# Patient Record
Sex: Male | Born: 1970 | Race: White | Hispanic: No | Marital: Married | Smoking: Former smoker
Health system: Southern US, Community
[De-identification: ages and names within clinical notes are randomized; demographics above are authoritative.]

## PROBLEM LIST (undated history)

## (undated) DIAGNOSIS — M1990 Unspecified osteoarthritis, unspecified site: Secondary | ICD-10-CM

## (undated) DIAGNOSIS — F32A Depression, unspecified: Secondary | ICD-10-CM

## (undated) DIAGNOSIS — R569 Unspecified convulsions: Secondary | ICD-10-CM

## (undated) DIAGNOSIS — E785 Hyperlipidemia, unspecified: Secondary | ICD-10-CM

## (undated) DIAGNOSIS — F419 Anxiety disorder, unspecified: Secondary | ICD-10-CM

## (undated) DIAGNOSIS — F319 Bipolar disorder, unspecified: Secondary | ICD-10-CM

## (undated) HISTORY — DX: Depression, unspecified: F32.A

## (undated) HISTORY — DX: Bipolar disorder, unspecified: F31.9

## (undated) HISTORY — PX: SHOULDER SURGERY: SHX246

## (undated) HISTORY — DX: Unspecified osteoarthritis, unspecified site: M19.90

## (undated) HISTORY — PX: KNEE ARTHROSCOPY: SHX127

## (undated) HISTORY — DX: Anxiety disorder, unspecified: F41.9

## (undated) HISTORY — PX: HAND SURGERY: SHX662

## (undated) HISTORY — PX: HERNIA REPAIR: SHX51

## (undated) HISTORY — DX: Unspecified convulsions: R56.9

## (undated) HISTORY — DX: Hyperlipidemia, unspecified: E78.5

---

## 2016-12-09 ENCOUNTER — Encounter: Payer: Self-pay | Admitting: Nurse Practitioner

## 2016-12-09 ENCOUNTER — Ambulatory Visit (INDEPENDENT_AMBULATORY_CARE_PROVIDER_SITE_OTHER): Payer: 59 | Admitting: Nurse Practitioner

## 2016-12-09 ENCOUNTER — Other Ambulatory Visit: Payer: Self-pay

## 2016-12-09 VITALS — BP 123/71 | Temp 98.3°F | Ht 73.0 in | Wt 191.0 lb

## 2016-12-09 DIAGNOSIS — F411 Generalized anxiety disorder: Secondary | ICD-10-CM

## 2016-12-09 DIAGNOSIS — Z7689 Persons encountering health services in other specified circumstances: Secondary | ICD-10-CM

## 2016-12-09 MED ORDER — BUPROPION HCL ER (XL) 150 MG PO TB24
150.0000 mg | ORAL_TABLET | Freq: Every day | ORAL | 1 refills | Status: DC
Start: 2016-12-09 — End: 2017-02-02

## 2016-12-09 MED ORDER — BUSPIRONE HCL 7.5 MG PO TABS: 7.5000 mg | ORAL_TABLET | Freq: Two times a day (BID) | ORAL | 1 refills | Status: DC

## 2016-12-09 NOTE — Progress Notes (Signed)
Subjective:    Patient ID: Troy Dunn, male    DOB: [birth year], 46 y.o.   MRN: [id number]  Troy Dunn is a 46 y.o. male presenting on 12/09/2016 for Establish Care (anxiety medication x 3972yrs. Pt was diagnose w/ bipolar.)   HPI Establish Care New Provider Obtain records from [facility] [place] [place] - No PCP in recent past.  Anxiety Was on anxiety medications 10 years ago.  Past medications, keppra, zyprexa, zoloft, paxil, seroquel (made him feel "like a zombie"). SSRI have always caused decreased libido and he states he does not want to try these again.  Was diagnosed w/ bipolar disorder by [facility] in [place], but has never really felt manic or depressed.  - He reports episodes of sudden onset rapid heart racing, feeling of needing to be "mobile/up walking around" during quiet activities like watching TV even when he does not have additional stressors.  Patient desires to resume treatment for his anxiety and bipolar disorder as his current response to stress is not manageable.  He also wants to avoid self-medication because he is also self medicating w/ marijuana 1-2 times per week.  Patient states it helps him sleep and does not sleep very well without it.  He has gone more than 48 hours without sleep in the last 2 weeks. 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  He is able to maintain work without behavior or attendance problems. - Now going through divorce she has moved from [place] to [place] to take care of his father w/ prostate CA and early onset dementia.  - 4 children are in HS, college, early careers.   Past Medical History:  Diagnosis Date  . Arthritis    R knee > L knee  . Bipolar disorder (HCC)   . Seizures (HCC)    Childhood, w/o seizures after age 89   Past Surgical History:  Procedure Laterality Date  . HAND SURGERY Left   . KNEE ARTHROSCOPY Bilateral   . SHOULDER SURGERY     Social  History   Socioeconomic History  . Marital status: Legally Separated    Spouse name: Not on file  . Number of children: 4  . Years of education: Not on file  . Highest education level: Some college, no degree  Social Needs  . Financial resource strain: Not on file  . Food insecurity - worry: Not on file  . Food insecurity - inability: Not on file  . Transportation needs - medical: Not on file  . Transportation needs - non-medical: Not on file  Occupational History  . Not on file  Tobacco Use  . Smoking status: Former Smoker    Packs/day: 1.50    Years: 10.00    Pack years: 15.00    Last attempt to quit: 12/09/2000    Years since quitting: 16.0  . Smokeless tobacco: Never Used  Substance and Sexual Activity  . Alcohol use: Yes  . Drug use: Yes    Frequency: 2.0 times per week    Types: Marijuana  . Sexual activity: Yes  Other Topics Concern  . Not on file  Social History Narrative  . Not on file   Family History  Problem Relation Age of Onset  . Heart disease Mother   . Heart disease Father   . Dementia Father   . Heart disease Maternal Grandmother   . Heart disease Maternal Grandfather   . Heart disease Paternal Grandmother   .  Healthy Daughter   . Healthy Son   . Healthy Daughter   . Healthy Son   . Epilepsy Sister   . Epilepsy Paternal Uncle    No current outpatient medications on file prior to visit.   No current facility-administered medications on file prior to visit.     Review of Systems  Constitutional: Negative.   HENT: Negative.   Eyes: Negative.   Respiratory: Negative.   Cardiovascular: Negative.   Gastrointestinal: Negative.   Endocrine: Negative.   Genitourinary: Negative.   Musculoskeletal: Negative.   Skin: Negative.   Allergic/Immunologic: Negative.   Neurological: Negative.   Hematological: Negative.   Psychiatric/Behavioral: Positive for agitation and sleep disturbance. Negative for behavioral problems, confusion, decreased  concentration, dysphoric mood, hallucinations, self-injury and suicidal ideas. The patient is nervous/anxious and is hyperactive.    Per HPI unless specifically indicated above     Objective:    BP 123/71 (BP Location: Right Arm, Patient Position: Sitting, Cuff Size: Normal)   Temp 98.3 F (36.8 C) (Oral)   Ht 6\' 1"  (1.854 m)   Wt 191 lb (86.6 kg)   BMI 25.20 kg/m   Wt Readings from Last 3 Encounters:  12/09/16 191 lb (86.6 kg)    Physical Exam  General - healthy, well-appearing, NAD HEENT - Normocephalic, atraumatic Heart - RRR, no murmurs heard Lungs - Clear throughout all lobes, no wheezing, crackles, or rhonchi. Normal work of breathing. Extremeties - non-tender, no edema, cap refill < 2 seconds, peripheral pulses intact +2 bilaterally Skin - warm, dry Neuro - awake, alert, oriented x3, normal gait Psych - anxious mood and affect, normal behavior.  Participates in conversation appropriately.     Assessment & Plan:   Problem List Items Addressed This Visit    None    Visit Diagnoses    Generalized anxiety disorder    -  Primary   Relevant Medications   busPIRone (BUSPAR) 7.5 MG tablet   buPROPion (WELLBUTRIN XL) 150 MG 24 hr tablet      Meds ordered this encounter  Medications  . busPIRone (BUSPAR) 7.5 MG tablet    Sig: Take 1 tablet (7.5 mg total) 2 (two) times daily by mouth.    Dispense:  60 tablet    Refill:  1    Order Specific Question:   Supervising Provider    Answer:   Smitty CordsKARAMALEGOS, ALEXANDER J [[id number]]  . buPROPion (WELLBUTRIN XL) 150 MG 24 hr tablet    Sig: Take 1 tablet (150 mg total) daily by mouth.    Dispense:  30 tablet    Refill:  1    Order Specific Question:   Supervising Provider    Answer:   Smitty CordsKARAMALEGOS, ALEXANDER J [[id number]]      Follow up plan: Return in about 4 weeks (around 01/06/2017) for anxiety.  Wilhelmina McardleLauren Izel Eisenhardt, DNP, AGPCNP-BC Adult Gerontology Primary Care Nurse Practitioner [facility] 12/19/2016, 4:50 PM

## 2016-12-09 NOTE — Patient Instructions (Addendum)
Tavyn, Thank you for coming in to clinic today.  1. For your anxiety: - START bupropion (Wellbutrin) XL 150 mg tablet.  Take one tablet by mouth once daily. - START buspirone (Buspar) 7.5 mg tablet.  Take one tablet twice daily.  Start with 1 tablet once daily for 7 days.  Then, increase to full dose.    2. For local resources for caregivers for managing dementia: [place]  Please schedule a follow-up appointment with Cassell Smiles, AGNP. Return in about 4 weeks (around 01/06/2017) for anxiety.  If you have any other questions or concerns, please feel free to call the clinic or send a message through [place]. You may also schedule an earlier appointment if necessary.  You will receive a survey after today's visit either digitally by e-mail or paper by [employer]. Your experiences and feedback matter to [place].  Please respond so we know how we are doing as we provide care for you.  Cassell Smiles, DNP, AGNP-BC Adult Gerontology Nurse Practitioner [facility], [facility]    Stress and Stress Management Stress is a normal reaction to life events. It is what you feel when life demands more than you are used to or more than you can handle. Some stress can be useful. For example, the stress reaction can help you catch the last bus of the day, study for a test, or meet a deadline at work. But stress that occurs too often or for too long can cause problems. It can affect your emotional health and interfere with relationships and normal daily activities. Too much stress can weaken your immune system and increase your risk for physical illness. If you already have a medical problem, stress can make it worse. What are the causes? All sorts of life events may cause stress. An event that causes stress for one person may not be stressful for another person. Major life events commonly cause stress. These may be positive or negative. Examples include losing your job, moving into a new  home, getting married, having a baby, or losing a loved one. Less obvious life events may also cause stress, especially if they occur day after day or in combination. Examples include working long hours, driving in traffic, caring for children, being in debt, or being in a difficult relationship. What are the signs or symptoms? Stress may cause emotional symptoms including, the following:  Anxiety. This is feeling worried, afraid, on edge, overwhelmed, or out of control.  Anger. This is feeling irritated or impatient.  Depression. This is feeling sad, down, helpless, or guilty.  Difficulty focusing, remembering, or making decisions.  Stress may cause physical symptoms, including the following:  Aches and pains. These may affect your head, neck, back, stomach, or other areas of your body.  Tight muscles or clenched jaw.  Low energy or trouble sleeping.  Stress may cause unhealthy behaviors, including the following:  Eating to feel better (overeating) or skipping meals.  Sleeping too little, too much, or both.  Working too much or putting off tasks (procrastination).  Smoking, drinking alcohol, or using drugs to feel better.  How is this diagnosed? Stress is diagnosed through an assessment by your health care provider. Your health care provider will ask questions about your symptoms and any stressful life events.Your health care provider will also ask about your medical history and may order blood tests or other tests. Certain medical conditions and medicine can cause physical symptoms similar to stress. Mental illness can cause emotional symptoms  and unhealthy behaviors similar to stress. Your health care provider may refer you to a mental health professional for further evaluation. How is this treated? Stress management is the recommended treatment for stress.The goals of stress management are reducing stressful life events and coping with stress in healthy ways. Techniques for  reducing stressful life events include the following:  Stress identification. Self-monitor for stress and identify what causes stress for you. These skills may help you to avoid some stressful events.  Time management. Set your priorities, keep a calendar of events, and learn to say "no." These tools can help you avoid making too many commitments.  Techniques for coping with stress include the following:  Rethinking the problem. Try to think realistically about stressful events rather than ignoring them or overreacting. Try to find the positives in a stressful situation rather than focusing on the negatives.  Exercise. Physical exercise can release both physical and emotional tension. The key is to find a form of exercise you enjoy and do it regularly.  Relaxation techniques. These relax the body and mind. Examples include yoga, meditation, tai chi, biofeedback, deep breathing, progressive muscle relaxation, listening to music, being out in nature, journaling, and other hobbies. Again, the key is to find one or more that you enjoy and can do regularly.  Healthy lifestyle. Eat a balanced diet, get plenty of sleep, and do not smoke. Avoid using alcohol or drugs to relax.  Strong support network. Spend time with family, friends, or other people you enjoy being around.Express your feelings and talk things over with someone you trust.  Counseling or talktherapy with a mental health professional may be helpful if you are having difficulty managing stress on your own. Medicine is typically not recommended for the treatment of stress.Talk to your health care provider if you think you need medicine for symptoms of stress. Follow these instructions at home:  Keep all follow-up visits as directed by your health care provider.  Take all medicines as directed by your health care provider. Contact a health care provider if:  Your symptoms get worse or you start having new symptoms.  You feel  overwhelmed by your problems and can no longer manage them on your own. Get help right away if:  You feel like hurting yourself or someone else. This information is not intended to replace advice given to you by your health care provider. Make sure you discuss any questions you have with your health care provider. Document Released: 07/02/2000 Document Revised: 06/14/2015 Document Reviewed: 08/31/2012 Elsevier Interactive Patient Education  2017 [employer].

## 2016-12-19 ENCOUNTER — Encounter: Payer: Self-pay | Admitting: Nurse Practitioner

## 2016-12-19 DIAGNOSIS — F411 Generalized anxiety disorder: Secondary | ICD-10-CM | POA: Insufficient documentation

## 2016-12-19 NOTE — Assessment & Plan Note (Signed)
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  Patient has also previously been diagnosed with bipolar disorder but has not exhibited signs of mania or severe depression.  Bipolar disorder diagnosis may not be appropriate.  Plan:  1.START bupropion (Wellbutrin) XL 150 mg tablet.  Take one tablet by mouth once daily. - START buspirone (Buspar) 7.5 mg tablet.  Take one tablet twice daily.  Start with 1 tablet once daily for 7 days.  Then, increase to full dose. 2.  Encouraged stress management strategies for self-care. 3.  Recommended patient use area resources for assistance with caregiving responsibilities.  Provided references for [place] [facility] for dementia resources. 4.  Return in 4 weeks for follow-up.

## 2016-12-23 ENCOUNTER — Ambulatory Visit (INDEPENDENT_AMBULATORY_CARE_PROVIDER_SITE_OTHER): Payer: Self-pay | Admitting: Nurse Practitioner

## 2016-12-23 ENCOUNTER — Encounter: Payer: Self-pay | Admitting: Nurse Practitioner

## 2016-12-23 ENCOUNTER — Other Ambulatory Visit: Payer: Self-pay

## 2016-12-23 VITALS — BP 131/71 | HR 57 | Temp 97.9°F | Ht 73.0 in | Wt 191.6 lb

## 2016-12-23 DIAGNOSIS — R197 Diarrhea, unspecified: Secondary | ICD-10-CM

## 2016-12-23 DIAGNOSIS — T732XXA Exhaustion due to exposure, initial encounter: Secondary | ICD-10-CM

## 2016-12-23 DIAGNOSIS — R110 Nausea: Secondary | ICD-10-CM

## 2016-12-23 NOTE — Patient Instructions (Addendum)
Troy Dunn, Thank you for coming in to clinic today.  1. You likely have a gastrointestinal (GI) viral illness that will run its course over 24-48 hours.   - Drink plenty of fluids including water and consider adding 1-2 gatorade/powerade drinks while you have vomiting or diarrhea. - Wash your hands frequently with soap and water.  Please schedule a follow-up appointment with Troy Dunn, AGNP. Return 1-2 days if symptoms worsen or fail to improve.  If you have any other questions or concerns, please feel free to call the clinic or send a message through MyChart. You may also schedule an earlier appointment if necessary.  You will receive a survey after today's visit either digitally by e-mail or paper by [employer] mail. Your experiences and feedback matter to us.  Please respond so we know how we are doing as we provide care for you.   Troy McardleLauren Shiya Fogelman, DNP, AGNP-BC Adult Gerontology Nurse Practitioner [facility], [facility]   Viral Gastroenteritis, Adult Viral gastroenteritis is also known as the stomach flu. This condition is caused by certain germs (viruses). These germs can be passed from person to person very easily (are very contagious). This condition can cause sudden watery poop (diarrhea), fever, and throwing up (vomiting). Having watery poop and throwing up can make you feel weak and cause you to get dehydrated. Dehydration can make you tired and thirsty, make you have a dry mouth, and make it so you pee (urinate) less often. Older adults and people with other diseases or a weak defense system (immune system) are at higher risk for dehydration. It is important to replace the fluids that you lose from having watery poop and throwing up. Follow these instructions at home: Follow instructions from your doctor about how to care for yourself at home. Eating and drinking  Follow these instructions as told by your doctor:  Take an oral rehydration solution (ORS). This is a drink  that is sold at pharmacies and stores.  Drink clear fluids in small amounts as you are able, such as: ? Water. ? Ice chips. ? Diluted fruit juice. ? Low-calorie sports drinks.  Eat bland, easy-to-digest foods in small amounts as you are able, such as: ? Bananas. ? Applesauce. ? Rice. ? Low-fat (lean) meats. ? Toast. ? Crackers.  Avoid fluids that have a lot of sugar or caffeine in them.  Avoid alcohol.  Avoid spicy or fatty foods.  General instructions  Drink enough fluid to keep your pee (urine) clear or pale yellow.  Wash your hands often. If you cannot use soap and water, use hand sanitizer.  Make sure that all people in your home wash their hands well and often.  Rest at home while you get better.  Take over-the-counter and prescription medicines only as told by your doctor.  Watch your condition for any changes.  Take a warm bath to help with any burning or pain from having watery poop.  Keep all follow-up visits as told by your doctor. This is important. Contact a doctor if:  You cannot keep fluids down.  Your symptoms get worse.  You have new symptoms.  You feel light-headed or dizzy.  You have muscle cramps. Get help right away if:  You have chest pain.  You feel very weak or you pass out (faint).  You see blood in your throw-up.  Your throw-up looks like coffee grounds.  You have bloody or black poop (stools) or poop that look like tar.  You have a very  bad headache, a stiff neck, or both.  You have a rash.  You have very bad pain, cramping, or bloating in your belly (abdomen).  You have trouble breathing.  You are breathing very quickly.  Your heart is beating very quickly.  Your skin feels cold and clammy.  You feel confused.  You have pain when you pee.  You have signs of dehydration, such as: ? Dark pee, hardly any pee, or no pee. ? Cracked lips. ? Dry mouth. ? Sunken eyes. ? Sleepiness. ? Weakness. This information  is not intended to replace advice given to you by your health care provider. Make sure you discuss any questions you have with your health care provider. Document Released: 06/25/2007 Document Revised: 07/27/2015 Document Reviewed: 09/12/2014 Elsevier Interactive Patient Education  2017 [employer]

## 2016-12-23 NOTE — Progress Notes (Signed)
Subjective:    Patient ID: Troy Dunn, male    DOB: [birth year], 46 y.o.   MRN: [id number]  Troy Dunn is a 46 y.o. male presenting on 12/23/2016 for GI flu (nauseated,, weakness, fatigue and lightheadedness x 1 day )   HPI Malaise Patient reports symptoms started yesterday around noon.  Symptoms include being nauseated, being very weak and fatigued, lightheaded with movements, and diarrhea. - Patient has nausea, but is able to eat and keep down food and liquid.  He reports this is tolerable without need for medications. - Patient reports some abdominal pain, but describes it mostly as being unsettled. - Diarrhea started yesterday evening w/ very soft stool and loose pieces, but he has had none today.   - He reports he surprisingly slept well last night without interruption, but he remains very weak and tired today.  He states he is usually walking around very active, but does not feel like doing any of these activities today.  He attempted to report to work this morning, but was unable to stay.  He did stay in work yesterday from noon to 4 but daily 1 hour early.  Panic disorder Patient reports having a panic attack lasting about 20 minutes in the grocery store last week with no apparent trigger.  Since starting his medications buspirone and bupropion, patient reports less intense and less frequent panic.  Is concerned today is about how long it would take for these medications to prevent severe panic attacks. -Patient is tolerating his new medications buspirone and bupropion well without side effects.  Social History   Tobacco Use  . Smoking status: Former Smoker    Packs/day: 1.50    Years: 10.00    Pack years: 15.00    Last attempt to quit: 12/09/2000    Years since quitting: 16.0  . Smokeless tobacco: Never Used  Substance Use Topics  . Alcohol use: Yes  . Drug use: Yes    Frequency: 2.0 times per week    Types: Marijuana    Review of Systems Per HPI unless  specifically indicated above     Objective:    BP 131/71 (BP Location: Right Arm, Patient Position: Sitting, Cuff Size: Normal)   Pulse (!) 57   Temp 97.9 F (36.6 C) (Oral)   Ht 6\' 1"  (1.854 m)   Wt 191 lb 9.6 oz (86.9 kg)   BMI 25.28 kg/m   Wt Readings from Last 3 Encounters:  12/23/16 191 lb 9.6 oz (86.9 kg)  12/09/16 191 lb (86.6 kg)    Physical Exam  General - healthy, sickly-appearing, NAD HEENT - Normocephalic, atraumatic, oropharynx clear w/ moist mucous membranes Neck - supple, non-tender, no LAD Heart - RRR, no murmurs heard Lungs - Clear throughout all lobes, no wheezing, crackles, or rhonchi. Normal work of breathing. Abdomen - soft, generalized tenderness, no masses, no hepatosplenomegaly, hyperactive bowel sounds Extremeties - non-tender, no edema, cap refill < 2 seconds, peripheral pulses intact +2 bilaterally Skin - warm, dry Neuro - awake, alert, oriented x3, normal gait Psych - Normal mood and affect, normal behavior       Assessment & Plan:   Problem List Items Addressed This Visit    None    Visit Diagnoses    Fatigue due to exposure, initial encounter    -  Primary   Nausea    Diarrhea of presumed infectious origin    Pt w/ acute illness consistent with viral gastroenteritis, x 16  hr symptoms diarrhea /  nausea. No known sick contacts. - Currently sick-appearing and non-toxic, clinically well hydrated on exam, benign abdomen except generalized tenderness on exam.  Plan: 1. Reassurance, likely self-limited w/ most severe symptoms 24-48 hours, lasting up to 7-10 days. 2. Continue PO as tolerated. Increase hydration, starting with small sips clear fluids (gatorade, water) 3. Continue Tylenol. May add Motrin q 6 hr PRN fever 4. Precautions given for need to seek ER care if not improving or worsening. 5. Followup 2-4 days for labs if needed for worsening symptoms.  Pt declines today.    Relevant Orders   Basic Metabolic Panel (BMET)   CBC with  Differential/Platelet           Follow up plan: Return 1-2 days if symptoms worsen or fail to improve.  Wilhelmina McardleLauren Martin Smeal, DNP, AGPCNP-BC Adult Gerontology Primary Care Nurse Practitioner [facility] 12/23/2016, 10:42 AM

## 2017-02-02 ENCOUNTER — Other Ambulatory Visit: Payer: Self-pay | Admitting: Nurse Practitioner

## 2017-02-02 DIAGNOSIS — F411 Generalized anxiety disorder: Secondary | ICD-10-CM

## 2017-02-02 NOTE — Telephone Encounter (Signed)
Pt does not have followup appointment scheduled for his anxiety.  Will need appointment in next 15-30 days.  Please schedule.

## 2017-11-23 ENCOUNTER — Ambulatory Visit (INDEPENDENT_AMBULATORY_CARE_PROVIDER_SITE_OTHER): Payer: BLUE CROSS/BLUE SHIELD | Admitting: Family Medicine

## 2017-11-23 ENCOUNTER — Encounter: Payer: Self-pay | Admitting: Family Medicine

## 2017-11-23 VITALS — BP 112/70 | HR 96 | Temp 98.0°F | Resp 16 | Ht 73.0 in | Wt 192.0 lb

## 2017-11-23 DIAGNOSIS — F902 Attention-deficit hyperactivity disorder, combined type: Secondary | ICD-10-CM | POA: Diagnosis not present

## 2017-11-23 DIAGNOSIS — F411 Generalized anxiety disorder: Secondary | ICD-10-CM | POA: Diagnosis not present

## 2017-11-23 DIAGNOSIS — E663 Overweight: Secondary | ICD-10-CM

## 2017-11-23 DIAGNOSIS — L723 Sebaceous cyst: Secondary | ICD-10-CM

## 2017-11-23 DIAGNOSIS — F909 Attention-deficit hyperactivity disorder, unspecified type: Secondary | ICD-10-CM | POA: Insufficient documentation

## 2017-11-23 DIAGNOSIS — Z7689 Persons encountering health services in other specified circumstances: Secondary | ICD-10-CM

## 2017-11-23 DIAGNOSIS — G4700 Insomnia, unspecified: Secondary | ICD-10-CM

## 2017-11-23 NOTE — Patient Instructions (Signed)
[facility] - [address], [place], [place] [address] Open Monday through Friday 1p-7p for walk-in clinic

## 2017-11-23 NOTE — Progress Notes (Signed)
Name: Troy Dunn   MRN: [id number]    DOB: [birth year]   Date:11/23/2017       Progress Note  Subjective  Chief Complaint  Chief Complaint  Patient presents with  . Establish Care  . Cyst    on head, need referral     HPI  Pt presents to establish care and for the following:   Cyst: Has been present to the LEFT scalp for 5-6 years, but it has started to become more bothersome as it has been growing a little bit more over the last year.  Denies pain, drainage, or bleeding. Has had 1 other sebaceous cyst in the past and had this removed while living in [place].   ADHD/Anxiety: He used to take medication for ADHD, but as he moved into adulthood he has done well without medications. He also struggles anxiety, but only 1-3 times a week. Does use marijuana sometimes for anxiety.  Took buspar and this did not help him; took Wellbutrin in the past and this also did not help.  He works as a Building services engineer at [employer] and does well in his position - task completion is not an issue.   Insomnia: long-standing history of insomnia - has taken abilify, lunesta, ambien and these caused very vivid dreams/nightmares.  Smokes marijuana to help him to sleep and this is effective.  Has tried melatonin and other natural remedies and this did not work either.  History of Seizures: Had seizures when he was a small child (none since age 12).  Social: Divorced; 4 children who are very active.  Non-smoker.  Declines STI testing today.  Overweight: Very slightly overweight; exercises regularly, eats a balanced diet.   HM: Declines labs today; decline TDAP - had in 2016; will get HIV and hep c at CPE w/ fasting labs.  Patient Active Problem List   Diagnosis Date Noted  . Generalized anxiety disorder 12/19/2016    Social History   Tobacco Use  . Smoking status: Former Smoker    Packs/day: 1.50    Years: 10.00    Pack years: 15.00    Last attempt to quit: 12/09/2000    Years since quitting:  16.9  . Smokeless tobacco: Never Used  Substance Use Topics  . Alcohol use: Yes     Current Outpatient Medications:  .  buPROPion (WELLBUTRIN XL) 150 MG 24 hr tablet, TAKE 1 TABLET (150 MG TOTAL) DAILY BY MOUTH. (Patient not taking: Reported on 11/23/2017), Disp: 30 tablet, Rfl: 0 .  busPIRone (BUSPAR) 7.5 MG tablet, TAKE 1 TABLET (7.5 MG TOTAL) 2 (TWO) TIMES DAILY BY MOUTH. (Patient not taking: Reported on 11/23/2017), Disp: 60 tablet, Rfl: 0  Allergies  Allergen Reactions  . Ultram [Tramadol Hcl] Itching    I personally reviewed active problem list, medication list, allergies, family history, social history, health maintenance, lab results with the patient/caregiver today.  ROS  Constitutional: Negative for fever or weight change.  Respiratory: Negative for cough and shortness of breath.   Cardiovascular: Negative for chest pain or palpitations.  Gastrointestinal: Negative for abdominal pain, no bowel changes.  Musculoskeletal: Negative for gait problem or joint swelling.  Skin: Negative for rash. See HPI Neurological: Negative for dizziness or headache.  No other specific complaints in a complete review of systems (except as listed in HPI above).  Objective  Vitals:   11/23/17 0915  BP: 112/70  Pulse: 96  Resp: 16  Temp: 98 F (36.7 C)  SpO2: 99%  Weight: 192  lb (87.1 kg)  Height: 6\' 1"  (1.854 m)   Body mass index is 25.33 kg/m.  Nursing Note and Vital Signs reviewed.  Physical Exam  Constitutional: Patient appears well-developed and well-nourished. No distress.  HENT: Head: Normocephalic and atraumatic. Ears: bilateral TMs with no erythema or effusion; Nose: Nose normal. Mouth/Throat: Oropharynx is clear and moist. No oropharyngeal exudate or tonsillar swelling.  Eyes: Conjunctivae and EOM are normal. No scleral icterus.  Pupils are equal, round, and reactive to light.  Neck: Normal range of motion. Neck supple. No JVD present. No thyromegaly present.    Cardiovascular: Normal rate, regular rhythm and normal heart sounds.  No murmur heard. No BLE edema. Pulmonary/Chest: Effort normal and breath sounds normal. No respiratory distress. Abdominal: Soft. Bowel sounds are normal, no distension. There is no tenderness. No masses. Musculoskeletal: Normal range of motion, no joint effusions. No gross deformities Neurological: Pt is alert and oriented to person, place, and time. No cranial nerve deficit. Coordination, balance, strength, speech and gait are normal.  Skin: Skin is warm and dry. No rash noted. No erythema. Sebaceous cyst present to LEFT frontal scalp - is non-tender, non-erythematous, no exudate. Psychiatric: Patient has a normal mood and affect. behavior is normal. Judgment and thought content normal.  No results found for this or any previous visit (from the past 72 hour(s)).  Assessment & Plan  1. Sebaceous cyst - Ambulatory referral to General Surgery  2. Attention deficit hyperactivity disorder (ADHD), combined type 3. Generalized anxiety disorder 4. Insomnia, unspecified type - He declines medication today; does use marijuana for symptom management and we discussed dangers of using illicit substances - encouraged cessation.  5. Encounter to establish care  6. Overweight (BMI 25.0-29.9) - Discussed importance of 150 minutes of physical activity weekly, eat two servings of fish weekly, eat one serving of tree nuts ( cashews, pistachios, pecans, almonds.Marland Kitchen) every other day, eat 6 servings of fruit/vegetables daily and drink plenty of water and avoid sweet beverages.

## 2017-11-30 ENCOUNTER — Ambulatory Visit (INDEPENDENT_AMBULATORY_CARE_PROVIDER_SITE_OTHER): Payer: BLUE CROSS/BLUE SHIELD | Admitting: General Surgery

## 2017-11-30 ENCOUNTER — Encounter: Payer: Self-pay | Admitting: General Surgery

## 2017-11-30 ENCOUNTER — Other Ambulatory Visit: Payer: Self-pay

## 2017-11-30 VITALS — BP 164/84 | HR 71 | Temp 98.1°F | Resp 18 | Ht 73.0 in | Wt 191.2 lb

## 2017-11-30 DIAGNOSIS — L723 Sebaceous cyst: Secondary | ICD-10-CM | POA: Diagnosis not present

## 2017-11-30 NOTE — Progress Notes (Signed)
Patient ID: Troy Dunn, male   DOB: 08/09/1970, 47 y.o.   MRN: [id number]  Chief Complaint  Patient presents with  . New Patient (Initial Visit)    Cyst on head    HPI Troy Dunn is a 47 y.o. male. He was referred by his PCP for further evaluation and treatment of a cyst on his head. He says it has been present for several years, but has gradually grown in size.  He had a similar cyst removed about 10 years ago, from the back of his head.  While it is not inherently painful, if it is bumped or rubbed, it is tender.  He is interested in having it removed.    Past Medical History:  Diagnosis Date  . Arthritis    R knee > L knee  . Bipolar disorder (HCC)   . Seizures (HCC)     Past Surgical History:  Procedure Laterality Date  . HAND SURGERY Left   . KNEE ARTHROSCOPY Bilateral   . SHOULDER SURGERY     Torn labrum    Family History  Problem Relation Age of Onset  . Heart disease Mother   . Heart disease Father   . Dementia Father   . Heart disease Maternal Grandmother   . Heart disease Maternal Grandfather   . Heart disease Paternal Grandmother   . Healthy Daughter   . Healthy Son   . Healthy Daughter   . Healthy Son   . Epilepsy Sister   . Epilepsy Paternal Uncle     Social History Social History   Tobacco Use  . Smoking status: Former Smoker    Packs/day: 1.50    Years: 10.00    Pack years: 15.00    Last attempt to quit: 12/09/2000    Years since quitting: 16.9  . Smokeless tobacco: Never Used  Substance Use Topics  . Alcohol use: Yes  . Drug use: Yes    Frequency: 2.0 times per week    Types: Marijuana    Allergies  Allergen Reactions  . Ultram [Tramadol Hcl] Itching    No current outpatient medications on file.   No current facility-administered medications for this visit.     Review of Systems Review of Systems  All other systems reviewed and are negative.   Blood pressure (!) 164/84, pulse 71, temperature 98.1 F (36.7 C),  temperature source Temporal, resp. rate 18, height 6\' 1"  (1.854 m), weight 191 lb 3.2 oz (86.7 kg), SpO2 97 %.  Physical Exam Physical Exam  Constitutional: He is oriented to person, place, and time. He appears well-developed and well-nourished. No distress.  HENT:  Head: Normocephalic and atraumatic.  Mouth/Throat: Oropharynx is clear and moist. No oropharyngeal exudate.  1 cm soft, mobile mass on the scalp just cranial to the left temple. Nontender to palpation.  Eyes: Pupils are equal, round, and reactive to light. No scleral icterus.  Neck: Normal range of motion. Neck supple. No thyromegaly present.  Cardiovascular: Normal rate, regular rhythm, normal heart sounds and intact distal pulses.  Pulmonary/Chest: Effort normal and breath sounds normal.  Abdominal: Soft. Bowel sounds are normal.  Musculoskeletal: Normal range of motion. He exhibits no edema.  Lymphadenopathy:    He has no cervical adenopathy.  Neurological: He is alert and oriented to person, place, and time.  Skin: Skin is warm and dry.  Heavily tattooed on both upper extremities.  Psychiatric: He has a normal mood and affect.    Data Reviewed No relevant labs or imaging  available.  Assessment    47 y/o M with a sebaceous cyst on his scalp. He desires removal due to growth and pain with trauma.    Plan    RTC next Monday AM for in-office procedure.       Duanne Guess 11/30/2017, 9:41 AM

## 2017-11-30 NOTE — Patient Instructions (Signed)
We will schedule your procedure 12/07/17.

## 2017-12-07 ENCOUNTER — Ambulatory Visit (INDEPENDENT_AMBULATORY_CARE_PROVIDER_SITE_OTHER): Payer: BLUE CROSS/BLUE SHIELD | Admitting: General Surgery

## 2017-12-07 ENCOUNTER — Encounter: Payer: Self-pay | Admitting: General Surgery

## 2017-12-07 ENCOUNTER — Other Ambulatory Visit: Payer: Self-pay

## 2017-12-07 VITALS — BP 150/88 | HR 64 | Temp 98.1°F | Resp 14 | Ht 73.0 in | Wt 193.0 lb

## 2017-12-07 DIAGNOSIS — L723 Sebaceous cyst: Secondary | ICD-10-CM

## 2017-12-07 NOTE — Progress Notes (Signed)
Excisional Biopsy Procedure Note  Pre-operative Diagnosis: Epidermal cyst  Post-operative Diagnosis: same  Locations: scalp  Indications: enlarging epidermal inclusion cyst  Anesthesia: Lidocaine 1% without epinephrine without added sodium bicarbonate  Procedure Details  The risks, benefits, indications, potential complications, and alternatives were explained to the patient and informed consent obtained.  The lesion and surrounding area was given a sterile prep using chlorhexidine and draped in the usual sterile fashion. A scalpel was used to incise the skin overlying the affected area.  Blunt dissection was used to excise an encapsulated cyst approximately 1cm by 1cm. The wound was closed in 2 layers with 3-0 Vicryl in the dermis and 4-0 Monocryl in the subcuticular layer. Dermabond and a sterile dressing applied. The specimen was sent for pathologic examination. The patient tolerated the procedure well.  EBL: minimal  Findings: well encapuslated cystic structure with caseous contents consistent with an epidermal inclusion cyst  Condition: Stable  Complications:  None  Plan: 1. Instructed to keep the wound dry and covered for 24-48 hours and clean thereafter. 2. Warning signs of infection were reviewed.   3. Recommended that the patient use OTC analgesics as needed for pain.  4. Return for wound check in 1 week.

## 2017-12-07 NOTE — Patient Instructions (Signed)
Follow up in one week for recheck. You may shower but don't submerge the area, pat dry. The surgical glue will fall off on it's own.     Excision of Skin Lesions, Care After Refer to this sheet in the next few weeks. These instructions provide you with information about caring for yourself after your procedure. Your health care provider may also give you more specific instructions. Your treatment has been planned according to current medical practices, but problems sometimes occur. Call your health care provider if you have any problems or questions after your procedure. What can I expect after the procedure? After your procedure, it is common to have pain or discomfort at the excision site. Follow these instructions at home:  Take over-the-counter and prescription medicines only as told by your health care provider.  Follow instructions from your health care provider about: ? How to take care of your excision site. You should keep the site clean, dry, and protected for at least 48 hours. ? When and how you should change your bandage (dressing). ? When you should remove your dressing. ? Removing whatever was used to close your excision site.  Check the excision area every day for signs of infection. Watch for: ? Redness, swelling, or pain. ? Fluid, blood, or pus.  For bleeding, apply gentle but firm pressure to the area using a folded towel for 20 minutes.  Avoid high-impact exercise and activities until the stitches (sutures) are removed or the area heals.  Follow instructions from your health care provider about how to minimize scarring. Avoid sun exposure until the area has healed. Scarring should lessen over time.  Keep all follow-up visits as told by your health care provider. This is important. Contact a health care provider if:  You have a fever.  You have redness, swelling, or pain at the excision site.  You have fluid, blood, or pus coming from the excision site.  You  have ongoing bleeding at the excision site.  You have pain that does not improve in 2-3 days after your procedure.  You notice skin irregularities or changes in sensation. This information is not intended to replace advice given to you by your health care provider. Make sure you discuss any questions you have with your health care provider. Document Released: 05/23/2014 Document Revised: 06/14/2015 Document Reviewed: 02/22/2014 Elsevier Interactive Patient Education  [employer]2018 [employer]

## 2017-12-08 ENCOUNTER — Encounter: Payer: BLUE CROSS/BLUE SHIELD | Admitting: Family Medicine

## 2017-12-10 ENCOUNTER — Encounter: Payer: BLUE CROSS/BLUE SHIELD | Admitting: Family Medicine

## 2017-12-15 ENCOUNTER — Encounter: Payer: BLUE CROSS/BLUE SHIELD | Admitting: Family Medicine

## 2017-12-16 ENCOUNTER — Ambulatory Visit (INDEPENDENT_AMBULATORY_CARE_PROVIDER_SITE_OTHER): Payer: BLUE CROSS/BLUE SHIELD | Admitting: General Surgery

## 2017-12-16 ENCOUNTER — Encounter: Payer: Self-pay | Admitting: General Surgery

## 2017-12-16 ENCOUNTER — Other Ambulatory Visit: Payer: Self-pay

## 2017-12-16 VITALS — BP 156/82 | HR 69 | Temp 97.9°F | Ht 73.0 in | Wt 191.0 lb

## 2017-12-16 DIAGNOSIS — L7211 Pilar cyst: Secondary | ICD-10-CM

## 2017-12-16 NOTE — Progress Notes (Signed)
Patient seen for wound check after excision of cyst on head. Final pathology:  Diagnosis Skin (M), scalp PILAR CYST Microscopic Description There is a cyst lined by a stratified epithelium with an abrupt follicular pattern of keratinization without atypia. This correlates with a pilar cyst.  No wound concerns.  Vitals:   12/16/17 1343  BP: (!) 156/82  Pulse: 69  Temp: 97.9 F (36.6 C)  SpO2: 98%   Focused exam: wound edges well approximated without any signs of infection  Doing well post-procedure.  No concerns.  RTC PRN

## 2017-12-16 NOTE — Patient Instructions (Signed)
Return as needed.The patient is aware to call back for any questions or concerns.  

## 2019-01-12 ENCOUNTER — Ambulatory Visit: Payer: HRSA Program | Attending: Internal Medicine

## 2019-01-12 DIAGNOSIS — Z20822 Contact with and (suspected) exposure to covid-19: Secondary | ICD-10-CM

## 2019-01-12 DIAGNOSIS — Z20828 Contact with and (suspected) exposure to other viral communicable diseases: Secondary | ICD-10-CM | POA: Insufficient documentation

## 2019-01-13 LAB — NOVEL CORONAVIRUS, NAA: SARS-CoV-2, NAA: NOT DETECTED

## 2019-01-15 ENCOUNTER — Telehealth: Payer: Self-pay | Admitting: Family Medicine

## 2019-01-15 NOTE — Telephone Encounter (Signed)
Negative COVID results given. Patient results "NOT Detected." Caller expressed understanding. ° °

## 2020-10-19 ENCOUNTER — Encounter: Payer: Self-pay | Admitting: General Surgery

## 2020-12-06 ENCOUNTER — Other Ambulatory Visit: Payer: Self-pay

## 2020-12-06 ENCOUNTER — Ambulatory Visit (INDEPENDENT_AMBULATORY_CARE_PROVIDER_SITE_OTHER): Payer: 59 | Admitting: Family Medicine

## 2020-12-06 ENCOUNTER — Encounter: Payer: Self-pay | Admitting: Family Medicine

## 2020-12-06 ENCOUNTER — Other Ambulatory Visit: Payer: Self-pay | Admitting: Family Medicine

## 2020-12-06 VITALS — BP 108/58 | HR 63 | Ht 73.0 in | Wt 182.0 lb

## 2020-12-06 DIAGNOSIS — F3112 Bipolar disorder, current episode manic without psychotic features, moderate: Secondary | ICD-10-CM | POA: Insufficient documentation

## 2020-12-06 DIAGNOSIS — Z13220 Encounter for screening for lipoid disorders: Secondary | ICD-10-CM | POA: Diagnosis not present

## 2020-12-06 DIAGNOSIS — R079 Chest pain, unspecified: Secondary | ICD-10-CM | POA: Diagnosis not present

## 2020-12-06 DIAGNOSIS — G8929 Other chronic pain: Secondary | ICD-10-CM

## 2020-12-06 DIAGNOSIS — M545 Low back pain, unspecified: Secondary | ICD-10-CM

## 2020-12-06 DIAGNOSIS — K4090 Unilateral inguinal hernia, without obstruction or gangrene, not specified as recurrent: Secondary | ICD-10-CM | POA: Insufficient documentation

## 2020-12-06 MED ORDER — QUETIAPINE FUMARATE ER 300 MG PO TB24: 300.0000 mg | ORAL_TABLET | Freq: Every day | ORAL | 0 refills | Status: DC

## 2020-12-06 MED ORDER — QUETIAPINE FUMARATE ER 300 MG PO TB24: 300.0000 mg | ORAL_TABLET | Freq: Every day | ORAL | 1 refills | Status: DC

## 2020-12-06 NOTE — Patient Instructions (Addendum)
-   Start Seroquel daily at bedtime - Obtain low back x-rays downstairs today - Obtain fasting labs with orders provided - Referral coordinator will contact you to set up follow-up with psychiatry and cardiology - Return for follow-up in 6 weeks, contact for any questions or concerns between now and then

## 2020-12-06 NOTE — Assessment & Plan Note (Signed)
Chronic issue described as intermittent central chest pain without radiation, nonexertional in nature, this in the setting of untreated bipolar disorder.  Cardiopulmonary findings today are benign with positive S1-S2, regular rate and rhythm, no additional heart sounds, no JVD noted, clear lung fields throughout without wheezes, rales, rhonchi. Plan for risk stratification labs, referral to cardiology, and close follow-up.

## 2020-12-06 NOTE — Telephone Encounter (Signed)
Okay to switch prescription to [facility] [address]?

## 2020-12-06 NOTE — Assessment & Plan Note (Signed)
Previously known diagnosis without active treatment, has previously seen psychiatry in the past.  He describes decreased need for sleep, increased racing thoughts, and feeling "worn out".  Denies fevers, chills, additional constitutional symptoms, does endorse chronic stable intermittent nonexertional chest pain.  Symptoms most consistent with manic episode in the setting of bipolar disorder.  Urgent referral to psychiatry has been placed, initiation of Seroquel discussed with patient and he is amenable to this.  We will maintain close follow-up with the patient for reevaluation, possible titration of medications.

## 2020-12-06 NOTE — Progress Notes (Signed)
     Primary Care / Sports Medicine Office Visit  Patient Information:  Patient ID: Hallie Ishida, male DOB: [birth year] Age: 50 y.o. MRN: [id number]   Koleton Duchemin is a pleasant 50 y.o. male presenting with the following:  Chief Complaint  Patient presents with   New Patient (Initial Visit)   Establish Care   Fatigue    X2 weeks; weakness, loss of appetite, unintentional weight loss, no pain other than chronic back pain per patient (8/10)    Patient Active Problem List   Diagnosis Date Noted   Direct left inguinal hernia 12/06/2020   Bipolar affective disorder, currently manic, moderate (HCC) 12/06/2020   Intermittent chest pain 12/06/2020   ADHD (attention deficit hyperactivity disorder) 11/23/2017   Insomnia 11/23/2017   Sebaceous cyst 11/23/2017   Generalized anxiety disorder 12/19/2016    Vitals:   12/06/20 1107  BP: (!) 108/58  Pulse: 63  SpO2: 97%   Vitals:   12/06/20 1107  Weight: 182 lb (82.6 kg)  Height: 6\' 1"  (1.854 m)   Body mass index is 24.01 kg/m.  No results found.   Independent interpretation of notes and tests performed by another provider:   None  Procedures performed:   None  Pertinent History, Exam, Impression, and Recommendations:   Intermittent chest pain Chronic issue described as intermittent central chest pain without radiation, nonexertional in nature, this in the setting of untreated bipolar disorder.  Cardiopulmonary findings today are benign with positive S1-S2, regular rate and rhythm, no additional heart sounds, no JVD noted, clear lung fields throughout without wheezes, rales, rhonchi. Plan for risk stratification labs, referral to cardiology, and close follow-up.  Bipolar affective disorder, currently manic, moderate (HCC) Previously known diagnosis without active treatment, has previously seen psychiatry in the past.  He describes decreased need for sleep, increased racing thoughts, and feeling "worn out".  Denies fevers,  chills, additional constitutional symptoms, does endorse chronic stable intermittent nonexertional chest pain.  Symptoms most consistent with manic episode in the setting of bipolar disorder.  Urgent referral to psychiatry has been placed, initiation of Seroquel discussed with patient and he is amenable to this.  We will maintain close follow-up with the patient for reevaluation, possible titration of medications.   Orders & Medications Meds ordered this encounter  Medications   QUEtiapine (SEROQUEL XR) 300 MG 24 hr tablet    Sig: Take 1 tablet (300 mg total) by mouth at bedtime.    Dispense:  60 tablet    Refill:  0   Orders Placed This Encounter  Procedures   DG Lumbar Spine Complete   TSH Rfx on Abnormal to Free T4   CBC   Lipid panel   Comprehensive metabolic panel   Apo A1 + B + Ratio   Ambulatory referral to Psychiatry   Ambulatory referral to Cardiology     Return in about 6 weeks (around 01/17/2021).     01/19/2021, MD   Primary Care Sports Medicine [facility]

## 2020-12-07 ENCOUNTER — Ambulatory Visit
Admission: RE | Admit: 2020-12-07 | Discharge: 2020-12-07 | Disposition: A | Discharge status: Home / Self Care | Payer: 59 | Source: Ambulatory Visit | Attending: Family Medicine | Admitting: Family Medicine

## 2020-12-07 ENCOUNTER — Ambulatory Visit
Admission: RE | Admit: 2020-12-07 | Discharge: 2020-12-07 | Disposition: A | Discharge status: Home / Self Care | Payer: 59 | Attending: Family Medicine | Admitting: Family Medicine

## 2020-12-07 DIAGNOSIS — G8929 Other chronic pain: Secondary | ICD-10-CM

## 2020-12-07 DIAGNOSIS — M545 Low back pain, unspecified: Secondary | ICD-10-CM | POA: Diagnosis present

## 2020-12-08 LAB — CBC
Hematocrit: 42.5 % (ref 37.5–51.0)
Hemoglobin: 14.7 g/dL (ref 13.0–17.7)
MCH: 32.7 pg (ref 26.6–33.0)
MCHC: 34.6 g/dL (ref 31.5–35.7)
MCV: 94 fL (ref 79–97)
Platelets: 257 10*3/uL (ref 150–450)
RBC: 4.5 x10E6/uL (ref 4.14–5.80)
RDW: 12.1 % (ref 11.6–15.4)
WBC: 4.5 10*3/uL (ref 3.4–10.8)

## 2020-12-08 LAB — LIPID PANEL
Chol/HDL Ratio: 4.3 ratio (ref 0.0–5.0)
Cholesterol, Total: 243 mg/dL — ABNORMAL HIGH (ref 100–199)
HDL: 57 mg/dL (ref >39)
LDL Chol Calc (NIH): 171 mg/dL — ABNORMAL HIGH (ref 0–99)
Triglycerides: 89 mg/dL (ref 0–149)
VLDL Cholesterol Cal: 15 mg/dL (ref 5–40)

## 2020-12-08 LAB — APO A1 + B + RATIO
Apolipo. B/A-1 Ratio: 0.7 ratio (ref 0.0–0.7)
Apolipoprotein A-1: 154 mg/dL (ref 101–178)
Apolipoprotein B: 109 mg/dL — ABNORMAL HIGH (ref <90)

## 2020-12-08 LAB — COMPREHENSIVE METABOLIC PANEL
ALT: 21 IU/L (ref 0–44)
AST: 19 IU/L (ref 0–40)
Albumin/Globulin Ratio: 2.1 (ref 1.2–2.2)
Albumin: 4.5 g/dL (ref 4.0–5.0)
Alkaline Phosphatase: 49 IU/L (ref 44–121)
BUN/Creatinine Ratio: 10 (ref 9–20)
BUN: 13 mg/dL (ref 6–24)
Bilirubin Total: 0.8 mg/dL (ref 0.0–1.2)
CO2: 23 mmol/L (ref 20–29)
Calcium: 9.6 mg/dL (ref 8.7–10.2)
Chloride: 103 mmol/L (ref 96–106)
Creatinine, Ser: 1.35 mg/dL — ABNORMAL HIGH (ref 0.76–1.27)
Globulin, Total: 2.1 g/dL (ref 1.5–4.5)
Glucose: 89 mg/dL (ref 70–99)
Potassium: 4.2 mmol/L (ref 3.5–5.2)
Sodium: 140 mmol/L (ref 134–144)
Total Protein: 6.6 g/dL (ref 6.0–8.5)
eGFR: 64 mL/min/{1.73_m2} (ref >59)

## 2020-12-08 LAB — TSH RFX ON ABNORMAL TO FREE T4: TSH: 1.15 u[IU]/mL (ref 0.450–4.500)

## 2020-12-18 NOTE — Telephone Encounter (Signed)
Patient scheduled for tomorrow and is aware of appt.

## 2020-12-18 NOTE — Telephone Encounter (Signed)
Sent referral coordinator a message. Awaiting response.

## 2020-12-19 ENCOUNTER — Encounter: Payer: Self-pay | Admitting: Cardiology

## 2020-12-19 ENCOUNTER — Ambulatory Visit (INDEPENDENT_AMBULATORY_CARE_PROVIDER_SITE_OTHER): Payer: 59 | Admitting: Cardiology

## 2020-12-19 ENCOUNTER — Other Ambulatory Visit: Payer: Self-pay

## 2020-12-19 VITALS — BP 120/80 | HR 74 | Ht 73.0 in | Wt 181.0 lb

## 2020-12-19 DIAGNOSIS — R079 Chest pain, unspecified: Secondary | ICD-10-CM

## 2020-12-19 DIAGNOSIS — E782 Mixed hyperlipidemia: Secondary | ICD-10-CM | POA: Diagnosis not present

## 2020-12-19 NOTE — Patient Instructions (Addendum)
Medication Instructions:  - Your physician recommends that you continue on your current medications as directed. Please refer to the Current Medication list given to you today.  *If you need a refill on your cardiac medications before your next appointment, please call your pharmacy*   Lab Work: - none ordered  If you have labs (blood work) drawn today and your tests are completely normal, you will receive your results only by: MyChart Message (if you have MyChart) OR A paper copy in the mail If you have any lab test that is abnormal or we need to change your treatment, we will call you to review the results.   Testing/Procedures: - Your physician has requested that you have an exercise tolerance test.   - you may eat a light breakfast/ lunch prior to your procedure - no caffeine for 24 hours prior to your test (coffee, tea, soft drinks, or chocolate)  - no smoking/ vaping for 4 hours prior to your test - you may take your regular medications the day of your test - bring any inhalers with you to your test - wear comfortable clothing & tennis/ non-skid shoes to walk on the treadmill - you may bring a water bottle with you for after the test   Follow-Up: At [facility], you and your health needs are our priority.  As part of our continuing mission to provide you with exceptional heart care, we have created designated Provider Care Teams.  These Care Teams include your primary Cardiologist (physician) and Advanced Practice Providers (APPs -  Physician Assistants and Nurse Practitioners) who all work together to provide you with the care you need, when you need it.  We recommend signing up for the patient portal called "MyChart".  Sign up information is provided on this After Visit Summary.  MyChart is used to connect with patients for Virtual Visits (Telemedicine).  Patients are able to view lab/test results, encounter notes, upcoming appointments, etc.  Non-urgent messages can be sent  to your provider as well.   To learn more about what you can do with MyChart, go to [email or web address].    Your next appointment:   After testing is complete   The format for your next appointment:   In Person  Provider:   Bryan Lemma, MD    Other Instructions  Exercise Stress Test An exercise stress test is a test to check how your heart works during exercise. You will need to walk on a treadmill or ride an exercise bike for this test. An electrocardiogram (ECG) will record your heartbeat when you are at rest and when you are exercising. You may have an ultrasound or nuclear test after the exercise test. The test is done to check for coronary artery disease (CAD). It is also done to: See how well you can exercise. Watch for high blood pressure during exercise. Test how well you can exercise after treatment. Check the blood flow to your arms and legs. If your test result is not normal, more testing may be needed. What happens before the procedure? Follow instructions from your doctor about what you cannot eat or drink. Do not have any drinks or foods that have caffeine in them for 24 hours before the test, or as told by your doctor. This includes coffee, tea (even decaf tea), sodas, chocolate, and cocoa. Ask your doctor about changing or stopping your normal medicines. This is important if you: Take diabetes medicines. Take beta-blocker medicines. Wear a nitroglycerin patch. If you use an  inhaler, bring it with you to the test. Do not put lotions, powders, creams, or oils on your chest before the test. Wear comfortable shoes and clothing. Do not use any products that have nicotine or tobacco in them, such as cigarettes and e-cigarettes. Stop using them at least 4 hours before the test. If you need help quitting, ask your doctor. What happens during the procedure?  Patches (electrodes) will be put on your chest. Wires will be connected to the patches. The wires will send  signals to a machine to record your heartbeat. Your heart rate will be watched while you are resting and while you are exercising. Your blood pressure will also be watched during the test. You will walk on a treadmill or use an exercise bike. If you cannot use these, you may be asked to turn a crank with your hands. The activity will get harder and will raise your heart rate. You may be asked to breathe into a tube a few times during the test. This measures the gases that you breathe out. You will be asked how you are feeling throughout the test. You will exercise until your heart reaches a target heart rate. You will stop early if: You feel dizzy. You have chest pain. You are out of breath. Your blood pressure is too high or too low. You have an irregular heartbeat. You have pain or aching in your arms or legs. The procedure may vary among doctors and hospitals. What happens after the procedure? Your blood pressure, heart rate, breathing rate, and blood oxygen level will be watched after the test. You may return to your normal diet and activities as told by your doctor. It is up to you to get the results of your test. Ask your doctor, or the department that is doing the test, when your results will be ready. Summary An exercise stress test is a test to check how your heart works during exercise. This test is done to check for coronary artery disease. Your heart rate will be watched while you are resting and while you are exercising. Follow instructions from your doctor about what you cannot eat or drink before the test. This information is not intended to replace advice given to you by your health care provider. Make sure you discuss any questions you have with your health care provider. Document Revised: 09/06/2019 Document Reviewed: 09/09/2019 Elsevier Patient Education  2022 [employer].

## 2020-12-19 NOTE — Progress Notes (Signed)
Primary Care Provider: Jerrol Banana, MD [facility] Cardiologist: None Electrophysiologist: None  Clinic Note: Chief Complaint  Patient presents with   Other    Intermittent Chest pain c/o rapid heart beats. Meds reviewed verbally with pt.    ===================================  ASSESSMENT/PLAN   Problem List Items Addressed This Visit       Cardiology Problems   Hyperlipidemia, mixed (Chronic)    LDL is 171.  Total cholesterol 243.  He has a significant family history of CAD. => At a minimum, should shoot for LDL less than 100.  I suspect he probably will need a statin.  Plan: For now we will evaluate with GXT.  Pending results, may or may not consider Coronary Calcium Score versus Coronary CTA.        Other   Intermittent chest pain - Primary    Pretty significant family history, with chest pain we would least evaluate with a GXT.  Symptoms do not seem to be very cardiac in nature.  Associated both rest and exertion.  Not very long-lasting.  He does have significant anxiety.  He also has poorly controlled lipids.  Plan:  Evaluate GXT. Pending results anticipate Risk Stratification with Coronary Calcium Score.      Relevant Orders   EKG 12-Lead (Completed)   Exercise Tolerance Test   Cardiac Stress Test: Informed Consent Details: Physician/Practitioner Attestation; Transcribe to consent form and obtain patient signature    ===================================  HPI:    Troy Dunn is a 50 y.o. male UNTREATED BIPOLAR DISORDER, and HYPERLIPIDEMIA who is being seen today for the evaluation of INTERMITTENT CHEST PAIN at the request of Jerrol Banana, MD.  Eber Hong was last seen by Dr. Ashley Royalty on 12/06/2020 (to establish new PCP) with complaints of intermittent chest pain.  Nonexertional.  Was thought to be borderline manic.  => Referred to Cardiology along with urgent referral to psychiatry.  Started on Seroquel.  Recent Hospitalizations:  None  Reviewed  CV studies:    The following studies were reviewed today: (if available, images/films reviewed: From Epic Chart or Care Everywhere) None:   Interval History:   Troy Dunn presents here today for cardiology evaluation somewhat concerned because he has a family history with multiple members on both sides having premature disease.  Both mother and father had heart attacks in their 39s to 50s as well as both maternal and paternal grandparents.  He says he has these episodes where every once in a while he will feel sharp pain over the middle of his chest going up to the upper shoulder.  He says his episodes only last about 1 to 2 minutes at a time and only on one occasion did not last even close to a minute.  Usually they are relatively short-lived.  They can occur with or without exertion.  Occasionally, he has had it spells where they last long enough they scare him.  After the occurred, he feels very dizzy afterwards and feels unusual Xience in his chest.  Feels like his heart is going faster.  For a while, he was noticing these episodes happening at least once twice a day.  Not having as much lately.  He also noted some exertional dyspnea.  He does acknowledge that he had gotten pretty bad with drinking a lot of alcohol for last 5 years or so.  He is now pretty much cut it possibly out.  CV Review of Symptoms (Summary) Cardiovascular ROS: positive for - chest pain, dyspnea  on exertion, and irregular heartbeat negative for - edema, loss of consciousness, orthopnea, paroxysmal nocturnal dyspnea, rapid heart rate, or shortness of breath  REVIEWED OF SYSTEMS   Review of Systems  Constitutional:  Negative for fever, malaise/fatigue (Just feels tired for a little while after having chest pain) and weight loss.  HENT:  Negative for nosebleeds.   Respiratory:  Negative for cough and shortness of breath.   Cardiovascular:  Positive for chest pain. Negative for orthopnea and  claudication.  Gastrointestinal:  Negative for blood in stool and melena.  Genitourinary:  Negative for hematuria.  Musculoskeletal:  Negative for joint pain and myalgias.  Neurological:  Negative for dizziness and focal weakness.  Psychiatric/Behavioral:  Negative for hallucinations and memory loss. The patient is nervous/anxious.    I have reviewed and (if needed) personally updated the patient's problem list, medications, allergies, past medical and surgical history, social and family history.   PAST MEDICAL HISTORY   Past Medical History:  Diagnosis Date   Anxiety    Arthritis    R knee > L knee   Bipolar disorder (HCC)    Depression    Seizures (HCC)     PAST SURGICAL HISTORY   Past Surgical History:  Procedure Laterality Date   HAND SURGERY Left    HERNIA REPAIR     KNEE ARTHROSCOPY Bilateral    SHOULDER SURGERY     Torn labrum     There is no immunization history on file for this patient.  MEDICATIONS/ALLERGIES   Current Meds  Medication Sig   Misc Natural Products (HERBAL ENERGY COMPLEX PO) Take 3-4 each by mouth daily. DELTA 8, DELTA 9, DELTA 10 gummies   QUEtiapine (SEROQUEL XR) 300 MG 24 hr tablet Take 1 tablet (300 mg total) by mouth at bedtime.    Allergies  Allergen Reactions   Ultram [Tramadol Hcl] Hives and Itching    SOCIAL HISTORY/FAMILY HISTORY   Reviewed in Epic:   Social History   Tobacco Use   Smoking status: Former    Packs/day: 1.50    Years: 10.00    Pack years: 15.00    Types: Cigarettes    Quit date: 12/09/2000    Years since quitting: 20.0   Smokeless tobacco: Never  Vaping Use   Vaping Use: Never used  Substance Use Topics   Alcohol use: Yes    Alcohol/week: 49.0 standard drinks    Types: 49 Standard drinks or equivalent per week   Drug use: Yes    Frequency: 2.0 times per week    Types: Marijuana, Other-see comments    Comment: DELTA 8, 9, &10   Social History   Social History Narrative   Not on file   Family  History  Problem Relation Age of Onset   Heart disease Mother    Heart attack Mother    Heart attack Father    Heart disease Father    Dementia Father    Epilepsy Sister    Healthy Daughter    Healthy Daughter    Healthy Son    Healthy Son    Epilepsy Paternal Uncle    Heart attack Maternal Grandmother    Heart disease Maternal Grandmother    Heart attack Maternal Grandfather    Heart disease Maternal Grandfather    Heart attack Paternal Grandmother    Heart disease Paternal Grandmother    Heart attack Paternal Grandfather    Heart disease Paternal Grandfather     OBJCTIVE -PE, EKG, labs  Wt Readings from Last 3 Encounters:  12/19/20 181 lb (82.1 kg)  12/06/20 182 lb (82.6 kg)  12/16/17 191 lb (86.6 kg)    Physical Exam: BP 120/80 (BP Location: Right Arm, Patient Position: Sitting, Cuff Size: Normal)   Pulse 74   Ht 6\' 1"  (1.854 m)   Wt 181 lb (82.1 kg)   SpO2 99%   BMI 23.88 kg/m  Physical Exam Vitals reviewed.  Constitutional:      General: He is not in acute distress.    Appearance: Normal appearance. He is normal weight. He is not toxic-appearing.  HENT:     Head: Normocephalic and atraumatic.  Neck:     Vascular: No carotid bruit.  Cardiovascular:     Rate and Rhythm: Normal rate and regular rhythm.     Pulses: Normal pulses.     Heart sounds: Normal heart sounds. No murmur heard.   No friction rub. No gallop.  Pulmonary:     Effort: Pulmonary effort is normal. No respiratory distress.     Breath sounds: Normal breath sounds.  Chest:     Chest wall: No tenderness.  Abdominal:     General: Abdomen is flat. Bowel sounds are normal. There is no distension.     Palpations: Abdomen is soft.     Tenderness: There is no abdominal tenderness.     Hernia: No hernia is present.  Musculoskeletal:     Cervical back: Normal range of motion and neck supple.  Skin:    General: Skin is warm and dry.     Coloration: Skin is not jaundiced.  Neurological:      General: No focal deficit present.     Mental Status: He is alert.     Gait: Gait normal.  Psychiatric:        Mood and Affect: Mood normal.        Behavior: Behavior normal.        Thought Content: Thought content normal.        Judgment: Judgment normal.     Comments: Seems anxious and somewhat jittery.     Adult ECG Report  Rate: 74 ;  Rhythm: normal sinus rhythm and Normal axis, intervals and durations. ;   Narrative Interpretation: Normal  Recent Labs:  reviewed  Lab Results  Component Value Date   CHOL 243 (H) 12/07/2020   HDL 57 12/07/2020   LDLCALC 171 (H) 12/07/2020   TRIG 89 12/07/2020   CHOLHDL 4.3 12/07/2020   Lab Results  Component Value Date   CREATININE 1.35 (H) 12/07/2020   BUN 13 12/07/2020   NA 140 12/07/2020   K 4.2 12/07/2020   CL 103 12/07/2020   CO2 23 12/07/2020   CBC Latest Ref Rng & Units 12/07/2020  WBC 3.4 - 10.8 x10E3/uL 4.5  Hemoglobin 13.0 - 17.7 g/dL 14.7  Hematocrit 37.5 - 51.0 % 42.5  Platelets 150 - 450 x10E3/uL 257    No results found for: HGBA1C Lab Results  Component Value Date   TSH 1.150 12/07/2020    ==================================================  COVID-19 Education: The signs and symptoms of COVID-19 were discussed with the patient and how to seek care for testing (follow up with PCP or arrange E-visit).    I spent a total of 24 minutes with the patient spent in direct patient consultation.  Additional time spent with chart review  / charting (studies, outside notes, etc): 18 min Total Time: 42 min  Current medicines are reviewed at length with the patient  today.  (+/- concerns) none  This visit occurred during the SARS-CoV-2 public health emergency.  Safety protocols were in place, including screening questions prior to the visit, additional usage of staff PPE, and extensive cleaning of exam room while observing appropriate contact time as indicated for disinfecting solutions.  Notice: This dictation was prepared  with Dragon dictation along with smart phrase technology. Any transcriptional errors that result from this process are unintentional and may not be corrected upon review.   Studies Ordered:  Orders Placed This Encounter  Procedures   Cardiac Stress Test: Informed Consent Details: Physician/Practitioner Attestation; Transcribe to consent form and obtain patient signature   Exercise Tolerance Test   EKG 12-Lead    Patient Instructions / Medication Changes & Studies & Tests Ordered   Patient Instructions  Medication Instructions:  - Your physician recommends that you continue on your current medications as directed. Please refer to the Current Medication list given to you today.  *If you need a refill on your cardiac medications before your next appointment, please call your pharmacy*   Lab Work: - none ordered  If you have labs (blood work) drawn today and your tests are completely normal, you will receive your results only by: [place] (if you have MyChart) OR A paper copy in the mail If you have any lab test that is abnormal or we need to change your treatment, we will call you to review the results.   Testing/Procedures: - Your physician has requested that you have an exercise tolerance test.   Follow-Up: Your next appointment:   After testing is complete   The format for your next appointment:   In Person  Provider:   Glenetta Hew, MD    Other Instructions  Exercise Stress Test An exercise stress test is a test to check how your heart works during exercise. You will need to walk on a treadmill or ride an exercise bike for this test. An electrocardiogram (ECG) will record your heartbeat when you are at rest and when you are exercising. You may have an ultrasound or nuclear test after the exercise test. The test is done to check for coronary artery disease (CAD).Doing    Glenetta Hew, M.D., M.S. Interventional Cardiologist   Pager # [phone] Phone #  [phone] [address] [place], [place] [address]   Thank you for choosing [facility] in [place]!!

## 2020-12-20 ENCOUNTER — Telehealth: Payer: Self-pay | Admitting: Cardiology

## 2020-12-20 NOTE — Telephone Encounter (Signed)
Called patient to schedule follow up after testing

## 2020-12-21 ENCOUNTER — Telehealth: Payer: Self-pay | Admitting: Cardiology

## 2020-12-21 NOTE — Telephone Encounter (Signed)
Called the patient to confirm his scheduled 12/24/20 gxt and review pre-test instructions.  Attempted to call the patient x2. Unable to lmom. Patients phone rings several times and then d/c. No voicemail available.   Pre-test instructions: - you may eat a light breakfast/ lunch prior to your procedure - no caffeine for 24 hours prior to your test (coffee, tea, soft drinks, or chocolate)  - no smoking/ vaping for 4 hours prior to your test - you may take your regular medications the day of your test except for - bring any inhalers with you to your test - wear comfortable clothing & tennis/ non-skid shoes to walk on the treadmill

## 2020-12-22 DIAGNOSIS — E782 Mixed hyperlipidemia: Secondary | ICD-10-CM | POA: Insufficient documentation

## 2020-12-22 NOTE — Assessment & Plan Note (Signed)
LDL is 171.  Total cholesterol 243.  He has a significant family history of CAD. => At a minimum, should shoot for LDL less than 100.  I suspect he probably will need a statin.  Plan: For now we will evaluate with GXT.  Pending results, may or may not consider Coronary Calcium Score versus Coronary CTA.

## 2020-12-22 NOTE — Assessment & Plan Note (Signed)
Pretty significant family history, with chest pain we would least evaluate with a GXT.  Symptoms do not seem to be very cardiac in nature.  Associated both rest and exertion.  Not very long-lasting.  He does have significant anxiety.  He also has poorly controlled lipids.  Plan:   Evaluate GXT.  Pending results anticipate Risk Stratification with Coronary Calcium Score.

## 2020-12-24 ENCOUNTER — Ambulatory Visit (INDEPENDENT_AMBULATORY_CARE_PROVIDER_SITE_OTHER): Payer: 59

## 2020-12-24 ENCOUNTER — Other Ambulatory Visit: Payer: Self-pay

## 2020-12-24 DIAGNOSIS — R079 Chest pain, unspecified: Secondary | ICD-10-CM

## 2020-12-24 HISTORY — PX: OTHER SURGICAL HISTORY: SHX169

## 2020-12-25 ENCOUNTER — Other Ambulatory Visit: Payer: Self-pay

## 2020-12-25 ENCOUNTER — Ambulatory Visit (INDEPENDENT_AMBULATORY_CARE_PROVIDER_SITE_OTHER): Payer: 59 | Admitting: Internal Medicine

## 2020-12-25 ENCOUNTER — Encounter: Payer: Self-pay | Admitting: Internal Medicine

## 2020-12-25 VITALS — BP 140/80 | HR 72 | Ht 73.0 in | Wt 184.6 lb

## 2020-12-25 DIAGNOSIS — B029 Zoster without complications: Secondary | ICD-10-CM | POA: Diagnosis not present

## 2020-12-25 MED ORDER — VALACYCLOVIR HCL 1 G PO TABS: 1000.0000 mg | ORAL_TABLET | Freq: Three times a day (TID) | ORAL | 0 refills | Status: AC

## 2020-12-25 MED ORDER — IBUPROFEN 600 MG PO TABS: 600.0000 mg | ORAL_TABLET | Freq: Three times a day (TID) | ORAL | 0 refills | Status: DC | PRN

## 2020-12-25 MED ORDER — NORTRIPTYLINE HCL 25 MG PO CAPS: 25.0000 mg | ORAL_CAPSULE | Freq: Every evening | ORAL | 0 refills | Status: DC | PRN

## 2020-12-25 NOTE — Progress Notes (Signed)
Date:  12/25/2020   Name:  Troy Dunn   DOB:  1970/12/14   MRN:  [id number]   Chief Complaint: Herpes Zoster  Rash This is a new problem. The current episode started in the past 7 days. The problem has been gradually worsening since onset. The affected locations include the torso. The rash is characterized by blistering, redness, itchiness and pain. He was exposed to nothing. Pertinent negatives include no fatigue, fever or shortness of breath.   Lab Results  Component Value Date   NA 140 12/07/2020   K 4.2 12/07/2020   CO2 23 12/07/2020   GLUCOSE 89 12/07/2020   BUN 13 12/07/2020   CREATININE 1.35 (H) 12/07/2020   CALCIUM 9.6 12/07/2020   EGFR 64 12/07/2020   Lab Results  Component Value Date   CHOL 243 (H) 12/07/2020   HDL 57 12/07/2020   LDLCALC 171 (H) 12/07/2020   TRIG 89 12/07/2020   CHOLHDL 4.3 12/07/2020   Lab Results  Component Value Date   TSH 1.150 12/07/2020   No results found for: HGBA1C Lab Results  Component Value Date   WBC 4.5 12/07/2020   HGB 14.7 12/07/2020   HCT 42.5 12/07/2020   MCV 94 12/07/2020   PLT 257 12/07/2020   Lab Results  Component Value Date   ALT 21 12/07/2020   AST 19 12/07/2020   ALKPHOS 49 12/07/2020   BILITOT 0.8 12/07/2020   No results found for: 25OHVITD2, 25OHVITD3, VD25OH   Review of Systems  Constitutional:  Negative for chills, fatigue and fever.  Respiratory:  Negative for chest tightness and shortness of breath.   Cardiovascular:  Negative for chest pain and palpitations.  Skin:  Positive for rash.  Psychiatric/Behavioral:  Negative for sleep disturbance.    Patient Active Problem List   Diagnosis Date Noted   Hyperlipidemia, mixed 12/22/2020   Direct left inguinal hernia 12/06/2020   Bipolar affective disorder, currently manic, moderate ([place]) 12/06/2020   Intermittent chest pain 12/06/2020   ADHD (attention deficit hyperactivity disorder) 11/23/2017   Insomnia 11/23/2017   Sebaceous cyst 11/23/2017    Generalized anxiety disorder 12/19/2016    Allergies  Allergen Reactions   Ultram [Tramadol Hcl] Hives and Itching    Past Surgical History:  Procedure Laterality Date   HAND SURGERY Left    HERNIA REPAIR     KNEE ARTHROSCOPY Bilateral    SHOULDER SURGERY     Torn labrum    Social History   Tobacco Use   Smoking status: Former    Packs/day: 1.50    Years: 10.00    Pack years: 15.00    Types: Cigarettes    Quit date: 12/09/2000    Years since quitting: 20.0   Smokeless tobacco: Never  Vaping Use   Vaping Use: Never used  Substance Use Topics   Alcohol use: Yes    Alcohol/week: 49.0 standard drinks    Types: 49 Standard drinks or equivalent per week   Drug use: Yes    Frequency: 2.0 times per week    Types: Marijuana, Other-see comments    Comment: DELTA 8, 9, &10     Medication list has been reviewed and updated.  Current Meds  Medication Sig   Misc Natural Products (HERBAL ENERGY COMPLEX PO) Take 3-4 each by mouth daily. DELTA 8, DELTA 9, DELTA 10 gummies   QUEtiapine (SEROQUEL XR) 300 MG 24 hr tablet Take 1 tablet (300 mg total) by mouth at bedtime.    PHQ 2/9  Scores 12/25/2020 12/06/2020 11/23/2017 12/09/2016  PHQ - 2 Score 0 5 0 1  PHQ- 9 Score 0 13 9 9     GAD 7 : Generalized Anxiety Score 12/25/2020 12/06/2020 12/09/2016  Nervous, Anxious, on Edge 0 3 3  Control/stop worrying 0 0 2  Worry too much - different things 0 0 2  Trouble relaxing 0 2 3  Restless 0 1 3  Easily annoyed or irritable 0 2 2  Afraid - awful might happen 0 0 1  Total GAD 7 Score 0 8 16  Anxiety Difficulty Not difficult at all Somewhat difficult Somewhat difficult    BP Readings from Last 3 Encounters:  12/25/20 140/80  12/19/20 120/80  12/06/20 (!) 108/58    Physical Exam Vitals and nursing note reviewed.  Constitutional:      General: He is not in acute distress.    Appearance: Normal appearance. He is well-developed.  HENT:     Head: Normocephalic and atraumatic.   Cardiovascular:     Rate and Rhythm: Normal rate and regular rhythm.  Pulmonary:     Effort: Pulmonary effort is normal. No respiratory distress.     Breath sounds: No wheezing or rhonchi.  Skin:    General: Skin is warm and dry.     Findings: Rash present.          Comments: Cluster of vesicles and a few small single lesions in the same dermatome  Neurological:     Mental Status: He is alert and oriented to person, place, and time.  Psychiatric:        Mood and Affect: Mood normal.        Behavior: Behavior normal.    Wt Readings from Last 3 Encounters:  12/25/20 184 lb 9.6 oz (83.7 kg)  12/19/20 181 lb (82.1 kg)  12/06/20 182 lb (82.6 kg)    BP 140/80   Pulse 72   Ht 6' 1"  (1.854 m)   Wt 184 lb 9.6 oz (83.7 kg)   SpO2 97%   BMI 24.36 kg/m   Assessment and Plan: 1. Herpes zoster without complication - valACYclovir (VALTREX) 1000 MG tablet; Take 1 tablet (1,000 mg total) by mouth 3 (three) times daily for 7 days.  Dispense: 21 tablet; Refill: 0 - ibuprofen (ADVIL) 600 MG tablet; Take 1 tablet (600 mg total) by mouth every 8 (eight) hours as needed for moderate pain.  Dispense: 30 tablet; Refill: 0   Partially dictated using Editor, commissioning. Any errors are unintentional.  Halina Maidens, MD [facility]  12/25/2020

## 2020-12-27 ENCOUNTER — Encounter: Payer: Self-pay | Admitting: Cardiology

## 2020-12-27 ENCOUNTER — Other Ambulatory Visit: Payer: Self-pay

## 2020-12-27 ENCOUNTER — Ambulatory Visit (INDEPENDENT_AMBULATORY_CARE_PROVIDER_SITE_OTHER): Payer: 59 | Admitting: Cardiology

## 2020-12-27 VITALS — BP 132/78 | HR 100 | Ht 73.0 in | Wt 181.0 lb

## 2020-12-27 DIAGNOSIS — E782 Mixed hyperlipidemia: Secondary | ICD-10-CM

## 2020-12-27 DIAGNOSIS — Z8249 Family history of ischemic heart disease and other diseases of the circulatory system: Secondary | ICD-10-CM | POA: Insufficient documentation

## 2020-12-27 DIAGNOSIS — R079 Chest pain, unspecified: Secondary | ICD-10-CM | POA: Diagnosis not present

## 2020-12-27 LAB — EXERCISE TOLERANCE TEST
Angina Index: 0
Duke Treadmill Score: 5
Estimated workload: 11
Exercise duration (min): 9 min
Exercise duration (sec): 36 s
MPHR: 171 {beats}/min
Peak HR: 157 {beats}/min
Percent HR: 91 %
Rest HR: 94 {beats}/min
ST Depression (mm): 1 mm

## 2020-12-27 NOTE — Progress Notes (Signed)
Primary Care Provider: Jerrol Banana, MD [facility] Cardiologist: None Electrophysiologist: None  Clinic Note: Chief Complaint  Patient presents with   Follow-up    Follow up and review test results. Medications verbally reviewed with patient.     ===================================  ASSESSMENT/PLAN   Problem List Items Addressed This Visit       Cardiology Problems   Hyperlipidemia, mixed - Primary (Chronic)    Very poor cholesterol.  He would like to give it a try of diet and exercise.  Made some adjustments to his diet already.   Will risk stratify with CORONARY CALCIUM SCORE -  pending results, may consider starting a statin..  .  Recheck labs in 3 months.      Relevant Orders   Lipid panel     Other   Intermittent chest pain    Okay to go back to the gym for exercising.  Relatively normal GXT.  No longer having chest pain.  I do suspect this is probably musculoskeletal pain.  He continues to be active and doing well.      Family history of premature CAD    Multiple family members with CAD.  He himself has high cholesterol levels but otherwise no true risk factors.  He is very interested in knowing his baseline risk.  With negative GXT, I am not aware of ischemia.  Therefore we will not proceed with coronary CTA.  Plan: Coronary Calcium Score. Will determine treatment options (statin, plus or minus ARB/beta-blocker) as well as plus or minus aspirin based on results.      ===================================  HPI:    Troy Dunn is a 50 y.o. male UNTREATED BIPOLAR DISORDER, and HYPERLIPIDEMIA who is being seen today for the evaluation of INTERMITTENT CHEST PAIN at the request of Troy Banana, MD. 12/06/2020 (to establish new PCP) with complaints of intermittent chest pain.  Nonexertional.  Was thought to be borderline manic.  => Referred to Cardiology along with urgent referral to psychiatry.  Started on Seroquel.  Troy Dunn was  just seen on 12/19/2020 at the request of Dr. Ashley Dunn in response to complaints of intermittent chest pain.  He was still thought to be in manic time.  Was started on Seroquel.  When I saw him he was no longer having chest comfort.  He indicated that there was a short period of time when he just felt really poorly and was having intermittent episodes of chest discomfort.  He has a pretty strong family history with both mother and father having heart attacks in their 47s as well as both paternal and maternal grandparents.   Chest pain was relatively atypical in nature.  Sharp pain over the middle chest and left shoulder lasting about a minute or 2 that occur with or without exertion, not necessarily associate with any exertion.  May be some dyspnea. Plan was to check GXT and based on the results either proceed with Coronary Calcium Score or Coronary CTA.  Recent Hospitalizations: None  Reviewed  CV studies:    The following studies were reviewed today: (if available, images/films reviewed: From Epic Chart or Care Everywhere) GXT 12/24/2020: LOW RISK. A Bruce protocol stress test was performed. Patient exercised for 9 min and 36 sec. Maximum HR of 157 bpm. MPHR 91.0 %. Peak METS 11.0 .  The patient experienced no angina during the test. The patient achieved the target heart rate. The patient reported no symptoms during the stress test. Normal blood pressure and normal heart  rate response noted during stress.   Interval History:   Troy Dunn presents here today for follow-up after his stress test.  He was very happy.  He is not having any more discomfort now.  He said that that must just been a weird spell.  He remains relatively asymptomatic at this point.   CV Review of Symptoms (Summary) Cardiovascular ROS: positive for - Rare irregular heartbeats palpitations. negative for - chest pain, dyspnea on exertion, edema, loss of consciousness, orthopnea, paroxysmal nocturnal dyspnea, rapid heart  rate, shortness of breath, or Syncope/near syncope or TIA/amaurosis fugax.  Claudication  REVIEWED OF SYSTEMS   Review of Systems  Constitutional:  Negative for fever, malaise/fatigue (Just feels tired for a little while after having chest pain) and weight loss.  HENT:  Negative for nosebleeds.   Respiratory:  Negative for cough and shortness of breath.   Cardiovascular:  Negative for chest pain (No further chest pain.), orthopnea and claudication.  Gastrointestinal:  Negative for blood in stool and melena.  Genitourinary:  Negative for hematuria.  Musculoskeletal:  Negative for joint pain and myalgias.  Neurological:  Negative for dizziness and focal weakness.  Psychiatric/Behavioral:  Negative for hallucinations and memory loss. The patient is nervous/anxious.    I have reviewed and (if needed) personally updated the patient's problem list, medications, allergies, past medical and surgical history, social and family history.   PAST MEDICAL HISTORY   Past Medical History:  Diagnosis Date   Anxiety    Arthritis    R knee > L knee   Bipolar disorder ([place])    Depression    Seizures ([place])     PAST SURGICAL HISTORY   Past Surgical History:  Procedure Laterality Date   HAND SURGERY Left    HERNIA REPAIR     KNEE ARTHROSCOPY Bilateral    SHOULDER SURGERY     Torn labrum    There is no immunization history on file for this patient.  MEDICATIONS/ALLERGIES   Current Meds  Medication Sig   nortriptyline (PAMELOR) 25 MG capsule Take 1 capsule (25 mg total) by mouth at bedtime as needed.   QUEtiapine (SEROQUEL XR) 300 MG 24 hr tablet Take 1 tablet (300 mg total) by mouth at bedtime.   valACYclovir (VALTREX) 1000 MG tablet Take 1 tablet (1,000 mg total) by mouth 3 (three) times daily for 7 days.    Allergies  Allergen Reactions   Ultram [Tramadol Hcl] Hives and Itching    SOCIAL HISTORY/FAMILY HISTORY   Reviewed in Epic:   Social History   Tobacco Use   Smoking  status: Former    Packs/day: 1.50    Years: 10.00    Pack years: 15.00    Types: Cigarettes    Quit date: 12/09/2000    Years since quitting: 20.0   Smokeless tobacco: Never  Vaping Use   Vaping Use: Never used  Substance Use Topics   Alcohol use: Yes    Alcohol/week: 49.0 standard drinks    Types: 49 Standard drinks or equivalent per week   Drug use: Yes    Frequency: 2.0 times per week    Types: Marijuana, Other-see comments    Comment: DELTA 8, 9, &10   Social History   Social History Narrative   He is prior Nature conservation officer.  Early retirement.      He does acknowledge that he had gotten pretty bad with drinking a lot of alcohol for last 5 years or so.  He is now pretty much cut  it possibly out.   Family History  Problem Relation Age of Onset   Heart disease Mother    Heart attack Mother    Heart attack Father    Heart disease Father    Dementia Father    Epilepsy Sister    Healthy Daughter    Healthy Daughter    Healthy Son    Healthy Son    Epilepsy Paternal Uncle    Heart attack Maternal Grandmother    Heart disease Maternal Grandmother    Heart attack Maternal Grandfather    Heart disease Maternal Grandfather    Heart attack Paternal Grandmother    Heart disease Paternal Grandmother    Heart attack Paternal Grandfather    Heart disease Paternal Grandfather     OBJCTIVE -PE, EKG, labs   Wt Readings from Last 3 Encounters:  12/27/20 181 lb (82.1 kg)  12/25/20 184 lb 9.6 oz (83.7 kg)  12/19/20 181 lb (82.1 kg)    Physical Exam: BP 132/78 (BP Location: Left Arm, Patient Position: Sitting, Cuff Size: Normal)   Pulse 100   Ht 6\' 1"  (1.854 m)   Wt 181 lb (82.1 kg)   SpO2 98%   BMI 23.88 kg/m  Physical Exam Vitals reviewed.  Constitutional:      General: He is not in acute distress.    Appearance: Normal appearance. He is normal weight. He is not ill-appearing.  Cardiovascular:     Pulses: Normal pulses.     Heart sounds: Normal heart sounds. No murmur  heard.   No friction rub. No gallop.  Pulmonary:     Effort: Pulmonary effort is normal.     Breath sounds: Normal breath sounds.  Neurological:     General: No focal deficit present.     Mental Status: He is alert and oriented to person, place, and time.  Psychiatric:        Mood and Affect: Mood normal.        Behavior: Behavior normal.        Thought Content: Thought content normal.        Judgment: Judgment normal.     Adult ECG Report None  Recent Labs:  reviewed  Lab Results  Component Value Date   CHOL 243 (H) 12/07/2020   HDL 57 12/07/2020   LDLCALC 171 (H) 12/07/2020   TRIG 89 12/07/2020   CHOLHDL 4.3 12/07/2020   Lab Results  Component Value Date   CREATININE 1.35 (H) 12/07/2020   BUN 13 12/07/2020   NA 140 12/07/2020   K 4.2 12/07/2020   CL 103 12/07/2020   CO2 23 12/07/2020   CBC Latest Ref Rng & Units 12/07/2020  WBC 3.4 - 10.8 x10E3/uL 4.5  Hemoglobin 13.0 - 17.7 g/dL 14.7  Hematocrit 37.5 - 51.0 % 42.5  Platelets 150 - 450 x10E3/uL 257    No results found for: HGBA1C Lab Results  Component Value Date   TSH 1.150 12/07/2020    ==================================================  COVID-19 Education: The signs and symptoms of COVID-19 were discussed with the patient and how to seek care for testing (follow up with PCP or arrange E-visit).    I spent a total of 19 minutes with the patient spent in direct patient consultation.  Additional time spent with chart review  / charting (studies, outside notes, etc): 14 min Total Time: 33 min  Current medicines are reviewed at length with the patient today.  (+/- concerns) none  This visit occurred during the SARS-CoV-2 public health emergency.  Safety protocols were in place, including screening questions prior to the visit, additional usage of staff PPE, and extensive cleaning of exam room while observing appropriate contact time as indicated for disinfecting solutions.  Notice: This dictation was  prepared with Dragon dictation along with smart phrase technology. Any transcriptional errors that result from this process are unintentional and may not be corrected upon review.   Studies Ordered:  Orders Placed This Encounter  Procedures   CT CARDIAC SCORING (SELF PAY ONLY)   Lipid panel    Patient Instructions / Medication Changes & Studies & Tests Ordered   Patient Instructions  Medication Instructions:  Your physician recommends that you continue on your current medications as directed. Please refer to the Current Medication list given to you today.  *If you need a refill on your cardiac medications before your next appointment, please call your pharmacy*   Lab Work: TO BE DONE IN FEB 2023: LIPIDS If you have labs (blood work) drawn today and your tests are completely normal, you will receive your results only by: [place] (if you have MyChart) OR A paper copy in the mail If you have any lab test that is abnormal or we need to change your treatment, we will call you to review the results.   Testing/Procedures: YOUR PROVIDER RECOMMENDS THAT YOU HAVE A CT CALCIUM SCORE DONE. THIS TEST IS SELF PAY LOCATION: OUTPATIENT IMAGING CENTER: [address], [place], [place] PHONE #: [phone]   Follow-Up: At [facility], you and your health needs are our priority.  As part of our continuing mission to provide you with exceptional heart care, we have created designated Provider Care Teams.  These Care Teams include your primary Cardiologist (physician) and Advanced Practice Providers (APPs -  Physician Assistants and Nurse Practitioners) who all work together to provide you with the care you need, when you need it.  We recommend signing up for the patient portal called "MyChart".  Sign up information is provided on this After Visit Summary.  MyChart is used to connect with patients for Virtual Visits (Telemedicine).  Patients are able to view lab/test  results, encounter notes, upcoming appointments, etc.  Non-urgent messages can be sent to your provider as well.   To learn more about what you can do with MyChart, go to [email or web address].    Your next appointment:   February 2023   The format for your next appointment:   In Person  Provider:   Glenetta Hew, MD       Glenetta Hew, M.D., M.S. Interventional Cardiologist   Pager # [phone] Phone # [phone] [address] [place], [place] [address]   Thank you for choosing [facility] in [place]!!

## 2020-12-27 NOTE — Patient Instructions (Signed)
Medication Instructions:  Your physician recommends that you continue on your current medications as directed. Please refer to the Current Medication list given to you today.  *If you need a refill on your cardiac medications before your next appointment, please call your pharmacy*   Lab Work: TO BE DONE IN FEB 2023: LIPIDS If you have labs (blood work) drawn today and your tests are completely normal, you will receive your results only by: MyChart Message (if you have MyChart) OR A paper copy in the mail If you have any lab test that is abnormal or we need to change your treatment, we will call you to review the results.   Testing/Procedures: YOUR PROVIDER RECOMMENDS THAT YOU HAVE A CT CALCIUM SCORE DONE. THIS TEST IS SELF PAY LOCATION: OUTPATIENT IMAGING CENTER: [address], [place], [place] PHONE #: [phone]   Follow-Up: At [facility], you and your health needs are our priority.  As part of our continuing mission to provide you with exceptional heart care, we have created designated Provider Care Teams.  These Care Teams include your primary Cardiologist (physician) and Advanced Practice Providers (APPs -  Physician Assistants and Nurse Practitioners) who all work together to provide you with the care you need, when you need it.  We recommend signing up for the patient portal called "MyChart".  Sign up information is provided on this After Visit Summary.  MyChart is used to connect with patients for Virtual Visits (Telemedicine).  Patients are able to view lab/test results, encounter notes, upcoming appointments, etc.  Non-urgent messages can be sent to your provider as well.   To learn more about what you can do with MyChart, go to [email or web address].    Your next appointment:   February 2023   The format for your next appointment:   In Person  Provider:   Bryan Lemma, MD

## 2020-12-27 NOTE — Assessment & Plan Note (Signed)
Multiple family members with CAD.  He himself has high cholesterol levels but otherwise no true risk factors.  He is very interested in knowing his baseline risk.  With negative GXT, I am not aware of ischemia.  Therefore we will not proceed with coronary CTA.  Plan: Coronary Calcium Score. Will determine treatment options (statin, plus or minus ARB/beta-blocker) as well as plus or minus aspirin based on results.

## 2020-12-27 NOTE — Assessment & Plan Note (Addendum)
Very poor cholesterol.  He would like to give it a try of diet and exercise.  Made some adjustments to his diet already.   Will risk stratify with CORONARY CALCIUM SCORE -  pending results, may consider starting a statin..  .  Recheck labs in 3 months.

## 2020-12-27 NOTE — Assessment & Plan Note (Addendum)
Okay to go back to the gym for exercising.  Relatively normal GXT.  No longer having chest pain.  I do suspect this is probably musculoskeletal pain.  He continues to be active and doing well.

## 2021-01-03 ENCOUNTER — Ambulatory Visit: Payer: Self-pay | Admitting: Family Medicine

## 2021-01-22 ENCOUNTER — Encounter: Payer: Self-pay | Admitting: Family Medicine

## 2021-01-22 ENCOUNTER — Other Ambulatory Visit: Payer: Self-pay

## 2021-01-22 ENCOUNTER — Ambulatory Visit (INDEPENDENT_AMBULATORY_CARE_PROVIDER_SITE_OTHER): Payer: 59 | Admitting: Family Medicine

## 2021-01-22 VITALS — BP 126/78 | HR 75 | Ht 73.0 in | Wt 182.0 lb

## 2021-01-22 DIAGNOSIS — R079 Chest pain, unspecified: Secondary | ICD-10-CM | POA: Diagnosis not present

## 2021-01-22 DIAGNOSIS — F3112 Bipolar disorder, current episode manic without psychotic features, moderate: Secondary | ICD-10-CM | POA: Diagnosis not present

## 2021-01-22 MED ORDER — HYDROXYZINE HCL 50 MG PO TABS: 50.0000 mg | ORAL_TABLET | Freq: Three times a day (TID) | ORAL | 3 refills | Status: DC | PRN

## 2021-01-22 MED ORDER — QUETIAPINE FUMARATE ER 400 MG PO TB24: 400.0000 mg | ORAL_TABLET | Freq: Every day | ORAL | 2 refills | Status: DC

## 2021-01-22 NOTE — Assessment & Plan Note (Signed)
Chronic issue with ongoing symptomatology, he reports that prior to yesterday, he had been noting overall improvement in his mood, less fluctuations, improved sleep to where he rarely utilize nortriptyline, has been able to discontinue cannabis and its various subtypes (delta 8, 9, 10) in addition to cutting back on his alcohol intake (drinks red wine occasionally).  Unfortunately, yesterday he experienced an episode of chest pressure, hyperventilation, nausea, bilateral upper extremity paresthesias.  This occurred while driving, episode resolved after switching drivers, drinking a glass of water, entire duration was roughly 15 minutes.  He "feels great "ever since.  Of note, he recently had reassuring cardiac exercise stress test, does have a history of hyperlipidemia, and has cardiac calcium score testing ordered for tomorrow.  He did not have a chance to reach out to his cardiologist over this history.  Physical examination today reveals benign vitals, regular rate and rhythm, positive S1-S2, no additional heart sounds, clear lung fields throughout to auscultation without wheezes, rales, rhonchi.  Etiologies can include cardiac and he does have ongoing cardiac work-up.  I did advise him to recheck his cardiologist for any additional testing if indicated.  Additionally allergy can include sequelae of discontinuation of cannabis/subtypes, panic attack, sequelae of bipolar disorder.  I have advised patient of the same as well as treatment strategies moving forward.  We will further titrate to 40 mg extended release of Seroquel daily, can utilize hydroxyzine on a as needed basis, he is to touch base with cardiologist for possible additional evaluation, and he is amenable to following up with psychiatrist.  I would like him to return in 2 months for reevaluation and to follow-up on the above.

## 2021-01-22 NOTE — Progress Notes (Signed)
Primary Care / Sports Medicine Office Visit  Patient Information:  Patient ID: Troy Dunn, male DOB: 16-Mar-1970 Age: 51 y.o. MRN: [id number]   Troy Dunn is a pleasant 51 y.o. male presenting with the following:  Chief Complaint  Patient presents with   Manic Behavior    Tolerating Seroquel 300 mg nightly well; [employer] reached out to patient on 01/11/21 and left voicemail for patient to call back to schedule   Chest Pain    Following with cardiology for work-up; per patient believes he had a mild heart attack yesterday due to chest tightness, nausea, dizziness, and bilateral hand numbness for 15 minutes; cardiac CT scheduled for tomorrow via cardiology; denies pain in office today    Patient Active Problem List   Diagnosis Date Noted   Family history of premature CAD 12/27/2020   Hyperlipidemia, mixed 12/22/2020   Direct left inguinal hernia 12/06/2020   Bipolar affective disorder, currently manic, moderate (HCC) 12/06/2020   Intermittent chest pain 12/06/2020   ADHD (attention deficit hyperactivity disorder) 11/23/2017   Insomnia 11/23/2017   Sebaceous cyst 11/23/2017   Generalized anxiety disorder 12/19/2016    Vitals:   01/22/21 1338  BP: 126/78  Pulse: 75  SpO2: 96%   Vitals:   01/22/21 1338  Weight: 182 lb (82.6 kg)  Height: 6\' 1"  (1.854 m)   Body mass index is 24.01 kg/m.  Exercise Tolerance Test  Result Date: 12/27/2020   1.0 mm of up sloping ST depression in the lateral leads (V3, V4, V5 and V6) was noted.   Prior study not available for comparison.     Independent interpretation of notes and tests performed by another provider:   None  Procedures performed:   None  Pertinent History, Exam, Impression, and Recommendations:   Bipolar affective disorder, currently manic, moderate (HCC) Chronic issue with ongoing symptomatology, he reports that prior to yesterday, he had been noting overall improvement in his mood, less fluctuations, improved  sleep to where he rarely utilize nortriptyline, has been able to discontinue cannabis and its various subtypes (delta 8, 9, 10) in addition to cutting back on his alcohol intake (drinks red wine occasionally).  Unfortunately, yesterday he experienced an episode of chest pressure, hyperventilation, nausea, bilateral upper extremity paresthesias.  This occurred while driving, episode resolved after switching drivers, drinking a glass of water, entire duration was roughly 15 minutes.  He "feels great "ever since.  Of note, he recently had reassuring cardiac exercise stress test, does have a history of hyperlipidemia, and has cardiac calcium score testing ordered for tomorrow.  He did not have a chance to reach out to his cardiologist over this history.  Physical examination today reveals benign vitals, regular rate and rhythm, positive S1-S2, no additional heart sounds, clear lung fields throughout to auscultation without wheezes, rales, rhonchi.  Etiologies can include cardiac and he does have ongoing cardiac work-up.  I did advise him to recheck his cardiologist for any additional testing if indicated.  Additionally allergy can include sequelae of discontinuation of cannabis/subtypes, panic attack, sequelae of bipolar disorder.  I have advised patient of the same as well as treatment strategies moving forward.  We will further titrate to 40 mg extended release of Seroquel daily, can utilize hydroxyzine on a as needed basis, he is to touch base with cardiologist for possible additional evaluation, and he is amenable to following up with psychiatrist.  I would like him to return in 2 months for reevaluation and to follow-up on the  above.  Intermittent chest pain Chronic issue that have been stable until yesterday where he noted significant exacerbation/recurrence.  Episode lasted roughly 15 minutes, see additional assessment(s) for plan details.  He does have upcoming cardiac CT score testing tomorrow, I did  advise him to contact his cardiologist to provide an update and to pursue additional testing through their specialty if deemed appropriate.  Over the interim, if symptoms were to recur, still symptomatic despite after mentioned measures, he is to proceed to the ER.   Orders & Medications Meds ordered this encounter  Medications   QUEtiapine (SEROQUEL XR) 400 MG 24 hr tablet    Sig: Take 1 tablet (400 mg total) by mouth at bedtime.    Dispense:  30 tablet    Refill:  2   hydrOXYzine (ATARAX) 50 MG tablet    Sig: Take 1 tablet (50 mg total) by mouth 3 (three) times daily as needed for anxiety.    Dispense:  30 tablet    Refill:  3   No orders of the defined types were placed in this encounter.    Return in about 2 months (around 03/22/2021).     Troy Banana, MD   Primary Care Sports Medicine [facility]

## 2021-01-22 NOTE — Assessment & Plan Note (Addendum)
Chronic issue that have been stable until yesterday where he noted significant exacerbation/recurrence.  Episode lasted roughly 15 minutes, see additional assessment(s) for plan details.  He does have upcoming cardiac CT score testing tomorrow, I did advise him to contact his cardiologist to provide an update and to pursue additional testing through their specialty if deemed appropriate.  Over the interim, if symptoms were to recur, still symptomatic despite after mentioned measures, he is to proceed to the ER.

## 2021-01-22 NOTE — Patient Instructions (Addendum)
-   Take new dose of Seroquel (4 mg) daily - Can dose Vistaril (hydroxyzine) up to 3 times a day on an as-needed basis for anxiety symptoms - Maintain follow-up with cardiology and establish care with psychiatry (contact number below)  [facility]: [phone] - If symptoms recur and persist despite the above, go to ER -Return for follow-up in 2 months

## 2021-01-23 ENCOUNTER — Inpatient Hospital Stay: Admission: RE | Admit: 2021-01-23 | Payer: 59 | Source: Ambulatory Visit

## 2021-01-23 ENCOUNTER — Ambulatory Visit (HOSPITAL_COMMUNITY)
Admission: RE | Admit: 2021-01-23 | Discharge: 2021-01-23 | Disposition: A | Discharge status: Home / Self Care | Payer: Self-pay | Source: Ambulatory Visit | Attending: Cardiology | Admitting: Cardiology

## 2021-01-23 DIAGNOSIS — Z136 Encounter for screening for cardiovascular disorders: Secondary | ICD-10-CM | POA: Insufficient documentation

## 2021-01-23 HISTORY — PX: OTHER SURGICAL HISTORY: SHX169

## 2021-01-24 ENCOUNTER — Telehealth: Payer: Self-pay | Admitting: *Deleted

## 2021-01-24 DIAGNOSIS — Z8249 Family history of ischemic heart disease and other diseases of the circulatory system: Secondary | ICD-10-CM

## 2021-01-24 DIAGNOSIS — E782 Mixed hyperlipidemia: Secondary | ICD-10-CM

## 2021-01-24 MED ORDER — ROSUVASTATIN CALCIUM 20 MG PO TABS: 20.0000 mg | ORAL_TABLET | Freq: Every day | ORAL | 3 refills | Status: DC

## 2021-01-24 NOTE — Telephone Encounter (Signed)
-----   Message from Leonie Man, MD sent at 01/23/2021 10:54 PM EST ----- Doristine Devoid news: Coronary artery calcium score is calculated at 0.  This means no evidence of coronary artery calcification as a marker of active coronary atherosclerosis.  Low risk findings.  This would indicate that it does not appear that there has been significant development of atherosclerosis in the coronary arteries, although with very poorly controlled cholesterol levels, and family history of coronary artery disease, I do think targeting an LDL level of less than 100 is reasonable.  This is likely can require this would likely require lifestyle modification with diet and exercise but also would probably require treatment with statin.  Would like to start rosuvastatin 20 mg daily (dispense 90 tabs, 3 refills). We can recheck cholesterol panel after 4 months.  Glenetta Hew, MD

## 2021-01-24 NOTE — Telephone Encounter (Signed)
Left detailed message on voicemail - prescription sent to pharmacy - will give labslip--to recheck in May 2023 at Feb appt 2023. Keep appointment in feb 2023

## 2021-01-31 ENCOUNTER — Encounter: Payer: Self-pay | Admitting: Family Medicine

## 2021-02-28 ENCOUNTER — Encounter: Payer: Self-pay | Admitting: Cardiology

## 2021-02-28 ENCOUNTER — Other Ambulatory Visit: Payer: Self-pay

## 2021-02-28 ENCOUNTER — Ambulatory Visit (INDEPENDENT_AMBULATORY_CARE_PROVIDER_SITE_OTHER): Payer: 59 | Admitting: Cardiology

## 2021-02-28 VITALS — BP 130/70 | HR 91 | Ht 73.0 in | Wt 188.0 lb

## 2021-02-28 DIAGNOSIS — Z8249 Family history of ischemic heart disease and other diseases of the circulatory system: Secondary | ICD-10-CM | POA: Diagnosis not present

## 2021-02-28 DIAGNOSIS — R079 Chest pain, unspecified: Secondary | ICD-10-CM | POA: Diagnosis not present

## 2021-02-28 DIAGNOSIS — E782 Mixed hyperlipidemia: Secondary | ICD-10-CM | POA: Diagnosis not present

## 2021-02-28 NOTE — Assessment & Plan Note (Signed)
Reassuring Coronary Calcium Score and nonischemic GXT.  These results portend relatively low risk, but with family history and significant elevated lipids, still recommended treating.

## 2021-02-28 NOTE — Assessment & Plan Note (Signed)
This is a chronic issue.  Doing relatively well now.  Nonischemic GXT and Coronary Calcium Score of 0 means it is very lower likelihood of it being cardiac etiology.

## 2021-02-28 NOTE — Progress Notes (Signed)
Primary Care Provider: Jerrol Banana, MD [facility] Cardiologist: None Electrophysiologist: None  Clinic Note: Chief Complaint  Patient presents with   Follow-up    2 month follow up-test results. Meds reviewed verbally with patient.     ===================================  ASSESSMENT/PLAN   Problem List Items Addressed This Visit       Cardiology Problems   Hyperlipidemia, mixed - Primary (Chronic)    With LDL of 171 and total cholesterol 243 regardless of Coronary Calcium Score, the presence of family history of CAD is concerning.  I do think we need to start treating at least try to bring the LDL down to less than 100.  Recommendation was to start rosuvastatin 20 mg daily.  Recheck labs after 3 months.        Other   Intermittent chest pain    This is a chronic issue.  Doing relatively well now.  Nonischemic GXT and Coronary Calcium Score of 0 means it is very lower likelihood of it being cardiac etiology.      Family history of premature CAD (Chronic)    Reassuring Coronary Calcium Score and nonischemic GXT.  These results portend relatively low risk, but with family history and significant elevated lipids, still recommended treating.     ===================================  HPI:    Troy Dunn is a 51 y.o. male UNTREATED BIPOLAR DISORDER, and HYPERLIPIDEMIA who is being seen today for continued follow-up evaluation of Hyperlipidemia, Family History of CAD with likely nonischemic chest pain.   He was seen in initial consultation on 12/19/2020 at the request of Dr. Ashley Royalty in response to complaints of intermittent chest pain.  He was still thought to be in manic time.  Was started on Seroquel.  When I saw him he was no longer having chest comfort.  He indicated that there was a short period of time when he just felt really poorly and was having intermittent episodes of chest discomfort.  He has a pretty strong family history with both mother and father  having heart attacks in their 76s as well as both paternal and maternal grandparents.   Chest pain was relatively atypical in nature.  Sharp pain over the middle chest and left shoulder lasting about a minute or 2 that occur with or without exertion, not necessarily associate with any exertion.  May be some dyspnea. Plan was to check GXT and based on the results either proceed with Coronary Calcium Score or Coronary CTA.   Troy Dunn seen on for additional follow-up on December 27, 2020: As a follow-up after stress test.  Very happy with results.  Not have any further discomfort.  Relatively asymptomatic. Ordered Coronary Calcium Score and lipid panel for baseline risk assessment.  Recent Hospitalizations: None  Reviewed  CV studies:    The following studies were reviewed today: (if available, images/films reviewed: From Epic Chart or Care Everywhere) Coronary Calcium Score 01/23/2021: No acute extracardiac findings.  Coronary Calcium Score 0.  Low risk.  Normal aorta. (Based on family history, recommended initiating treatment for lipids-started rosuvastatin 20 mg daily)  Interval History:   Troy Dunn presents today doing well. Hoping to get back enrolled in a work-out program for accountability.  Doing well with minimal palpitations - only 1 day of "heart running away" -- able calm down & drink cold water - thought about taking Atarax, but able to "get control".    Reviewing his PCPs note it appears that he may be in a manic phase.  He  is somewhat hyper in speech but seems to be relatively well controlled.  He has his great ideas of joining the gym and doing exercise hopefully this will carry through when he is no longer manic.  Regardless, he is feeling well with no chest pain or pressure.  Palpitations seem to be pretty well controlled.  CV Review of Symptoms (Summary) Cardiovascular ROS: positive for - Rare irregular heartbeats palpitations. negative for - chest pain, dyspnea on  exertion, edema, loss of consciousness, orthopnea, paroxysmal nocturnal dyspnea, rapid heart rate, shortness of breath, or Syncope/near syncope or TIA/amaurosis fugax.  Claudication  REVIEWED OF SYSTEMS   Review of Systems  Constitutional:  Negative for fever, malaise/fatigue and weight loss.  HENT:  Negative for nosebleeds.   Respiratory:  Negative for cough and shortness of breath.   Cardiovascular:  Negative for chest pain, orthopnea and claudication.  Gastrointestinal:  Negative for blood in stool and melena.  Genitourinary:  Negative for hematuria.  Musculoskeletal:  Negative for joint pain and myalgias.  Neurological:  Negative for dizziness and focal weakness.  Psychiatric/Behavioral:  Negative for hallucinations and memory loss. The patient is nervous/anxious.        ?  Flight of ideas   I have reviewed and (if needed) personally updated the patient's problem list, medications, allergies, past medical and surgical history, social and family history.   PAST MEDICAL HISTORY   Past Medical History:  Diagnosis Date   Anxiety    Arthritis    R knee > L knee   Bipolar disorder ([place])    Depression    Seizures ([place])     PAST SURGICAL HISTORY   Past Surgical History:  Procedure Laterality Date   Coronary Calcium Score  01/23/2021   No acute extracardiac findings.  Coronary Calcium Score 0.  Low risk.  Normal aorta. (Based on family history, recommended initiating treatment for lipids-started rosuvastatin 20 mg daily)   GRADUATED EXERCISE TOLERANCE TEST (GXT)  12/24/2020   A Bruce protocol stress test was performed. Patient exercised for 9 min and 36 sec. Maximum HR of 157 bpm. MPHR 91.0 %. Peak METS 11.0 .  The patient experienced no angina during the test. The patient achieved the target heart rate. The patient reported no symptoms during the stress test. Normal blood pressure and normal heart rate response noted during stress.  LOW RISK   HAND SURGERY Left    HERNIA REPAIR      KNEE ARTHROSCOPY Bilateral    SHOULDER SURGERY     Torn labrum    There is no immunization history on file for this patient.  MEDICATIONS/ALLERGIES   Current Meds  Medication Sig   hydrOXYzine (ATARAX) 50 MG tablet Take 1 tablet (50 mg total) by mouth 3 (three) times daily as needed for anxiety.   nortriptyline (PAMELOR) 25 MG capsule Take 1 capsule (25 mg total) by mouth at bedtime as needed.   QUEtiapine (SEROQUEL XR) 400 MG 24 hr tablet Take 1 tablet (400 mg total) by mouth at bedtime.   rosuvastatin (CRESTOR) 20 MG tablet Take 1 tablet (20 mg total) by mouth daily.    Allergies  Allergen Reactions   Ultram [Tramadol Hcl] Hives and Itching    SOCIAL HISTORY/FAMILY HISTORY   Reviewed in Epic:   Social History   Tobacco Use   Smoking status: Former    Packs/day: 1.50    Years: 10.00    Pack years: 15.00    Types: Cigarettes    Quit date: 12/09/2000  Years since quitting: 20.2   Smokeless tobacco: Never  Vaping Use   Vaping Use: Never used  Substance Use Topics   Alcohol use: Yes    Alcohol/week: 49.0 standard drinks    Types: 49 Standard drinks or equivalent per week   Drug use: Yes    Frequency: 2.0 times per week    Types: Marijuana, Other-see comments    Comment: DELTA 8, 9, &10   Social History   Social History Narrative   He is prior Hotel manager.  Early retirement.      He does acknowledge that he had gotten pretty bad with drinking a lot of alcohol for last 5 years or so.  He is now pretty much cut it possibly out.   Family History  Problem Relation Age of Onset   Heart disease Mother    Heart attack Mother    Heart attack Father    Heart disease Father    Dementia Father    Epilepsy Sister    Healthy Daughter    Healthy Daughter    Healthy Son    Healthy Son    Epilepsy Paternal Uncle    Heart attack Maternal Grandmother    Heart disease Maternal Grandmother    Heart attack Maternal Grandfather    Heart disease Maternal Grandfather     Heart attack Paternal Grandmother    Heart disease Paternal Grandmother    Heart attack Paternal Grandfather    Heart disease Paternal Grandfather     OBJCTIVE -PE, EKG, labs   Wt Readings from Last 3 Encounters:  02/28/21 188 lb (85.3 kg)  01/22/21 182 lb (82.6 kg)  12/27/20 181 lb (82.1 kg)    Physical Exam: BP 130/70 (BP Location: Left Arm, Patient Position: Sitting, Cuff Size: Normal)    Pulse 91    Ht 6\' 1"  (1.854 m)    Wt 188 lb (85.3 kg)    SpO2 99%    BMI 24.80 kg/m  Physical Exam Constitutional:      Appearance: Normal appearance. He is normal weight. He is not ill-appearing.  Musculoskeletal:        General: No swelling.     Cervical back: Normal range of motion and neck supple.  Neurological:     General: No focal deficit present.     Mental Status: He is alert and oriented to person, place, and time.  Psychiatric:        Mood and Affect: Mood normal.        Behavior: Behavior normal.        Thought Content: Thought content normal.        Judgment: Judgment normal.     Comments: Somewhat anxious     Adult ECG Report None  Recent Labs:  reviewed  Lab Results  Component Value Date   CHOL 243 (H) 12/07/2020   HDL 57 12/07/2020   LDLCALC 171 (H) 12/07/2020   TRIG 89 12/07/2020   CHOLHDL 4.3 12/07/2020   Lab Results  Component Value Date   CREATININE 1.35 (H) 12/07/2020   BUN 13 12/07/2020   NA 140 12/07/2020   K 4.2 12/07/2020   CL 103 12/07/2020   CO2 23 12/07/2020   CBC Latest Ref Rng & Units 12/07/2020  WBC 3.4 - 10.8 x10E3/uL 4.5  Hemoglobin 13.0 - 17.7 g/dL 32.9  Hematocrit 92.4 - 51.0 % 42.5  Platelets 150 - 450 x10E3/uL 257    No results found for: HGBA1C Lab Results  Component Value Date  TSH 1.150 12/07/2020    ==================================================  COVID-19 Education: The signs and symptoms of COVID-19 were discussed with the patient and how to seek care for testing (follow up with PCP or arrange E-visit).    I  spent a total of 11 minutes with the patient spent in direct patient consultation.  Additional time spent with chart review  / charting (studies, outside notes, etc): 10 min Total Time: 21 min  Current medicines are reviewed at length with the patient today.  (+/- concerns) none  This visit occurred during the SARS-CoV-2 public health emergency.  Safety protocols were in place, including screening questions prior to the visit, additional usage of staff PPE, and extensive cleaning of exam room while observing appropriate contact time as indicated for disinfecting solutions.  Notice: This dictation was prepared with Dragon dictation along with smart phrase technology. Any transcriptional errors that result from this process are unintentional and may not be corrected upon review.   Studies Ordered:  No orders of the defined types were placed in this encounter.   Patient Instructions / Medication Changes & Studies & Tests Ordered   Patient Instructions  Medication Instructions:  Your physician recommends that you continue on your current medications as directed. Please refer to the Current Medication list given to you today.  *If you need a refill on your cardiac medications before your next appointment, please call your pharmacy*   Lab Work: None ordered If you have labs (blood work) drawn today and your tests are completely normal, you will receive your results only by: [place] (if you have MyChart) OR A paper copy in the mail If you have any lab test that is abnormal or we need to change your treatment, we will call you to review the results.   Testing/Procedures: None ordered   Follow-Up: At [facility], you and your health needs are our priority.  As part of our continuing mission to provide you with exceptional heart care, we have created designated Provider Care Teams.  These Care Teams include your primary Cardiologist (physician) and Advanced Practice Providers  (APPs -  Physician Assistants and Nurse Practitioners) who all work together to provide you with the care you need, when you need it.  We recommend signing up for the patient portal called "MyChart".  Sign up information is provided on this After Visit Summary.  MyChart is used to connect with patients for Virtual Visits (Telemedicine).  Patients are able to view lab/test results, encounter notes, upcoming appointments, etc.  Non-urgent messages can be sent to your provider as well.   To learn more about what you can do with MyChart, go to [email or web address].    Your next appointment:   As needed  The format for your next appointment:   In Person  Provider:   You may see Glenetta Hew, MD or one of the following Advanced Practice Providers on your designated Care Team:   Murray Hodgkins, NP Christell Faith, PA-C Cadence Kathlen Mody, [place]   Other Instructions N/A       Glenetta Hew, M.D., M.S. Interventional Cardiologist   Pager # [phone] Phone # [phone] [address]. [place], [place] [address]   Thank you for choosing Heartcare in [place]!!

## 2021-02-28 NOTE — Assessment & Plan Note (Signed)
With LDL of 171 and total cholesterol 243 regardless of Coronary Calcium Score, the presence of family history of CAD is concerning.  I do think we need to start treating at least try to bring the LDL down to less than 100.  Recommendation was to start rosuvastatin 20 mg daily.  Recheck labs after 3 months.

## 2021-02-28 NOTE — Patient Instructions (Signed)
Medication Instructions:  ? ?Your physician recommends that you continue on your current medications as directed. Please refer to the Current Medication list given to you today.  ? ?*If you need a refill on your cardiac medications before your next appointment, please call your pharmacy* ? ? ?Lab Work: ?None ordered ? ?If you have labs (blood work) drawn today and your tests are completely normal, you will receive your results only by: ?MyChart Message (if you have MyChart) OR ?A paper copy in the mail ?If you have any lab test that is abnormal or we need to change your treatment, we will call you to review the results. ? ? ?Testing/Procedures: ?None ordered ? ? ?Follow-Up: ?At [facility], you and your health needs are our priority.  As part of our continuing mission to provide you with exceptional heart care, we have created designated Provider Care Teams.  These Care Teams include your primary Cardiologist (physician) and Advanced Practice Providers (APPs -  Physician Assistants and Nurse Practitioners) who all work together to provide you with the care you need, when you need it. ? ?We recommend signing up for the patient portal called "MyChart".  Sign up information is provided on this After Visit Summary.  MyChart is used to connect with patients for Virtual Visits (Telemedicine).  Patients are able to view lab/test results, encounter notes, upcoming appointments, etc.  Non-urgent messages can be sent to your provider as well.   ?To learn more about what you can do with MyChart, go to [email or web address].   ? ?Your next appointment:   ?As needed ? ?The format for your next appointment:   ?In Person ? ?Provider:   ?You may see David Harding, MD or one of the following Advanced Practice Providers on your designated Care Team:   ?Christopher Berge, NP ?Ryan Dunn, PA-C ?Cadence Furth, PA-C ? ? ?Other Instructions ?N/A ?

## 2021-03-13 ENCOUNTER — Ambulatory Visit: Payer: Self-pay | Admitting: Psychiatry

## 2021-03-22 ENCOUNTER — Encounter: Payer: Self-pay | Admitting: Family Medicine

## 2021-03-22 ENCOUNTER — Ambulatory Visit (INDEPENDENT_AMBULATORY_CARE_PROVIDER_SITE_OTHER): Payer: 59 | Admitting: Family Medicine

## 2021-03-22 ENCOUNTER — Other Ambulatory Visit: Payer: Self-pay

## 2021-03-22 VITALS — BP 120/70 | HR 71 | Ht 73.0 in | Wt 190.0 lb

## 2021-03-22 DIAGNOSIS — M170 Bilateral primary osteoarthritis of knee: Secondary | ICD-10-CM

## 2021-03-22 DIAGNOSIS — F3112 Bipolar disorder, current episode manic without psychotic features, moderate: Secondary | ICD-10-CM | POA: Diagnosis not present

## 2021-03-22 DIAGNOSIS — R079 Chest pain, unspecified: Secondary | ICD-10-CM

## 2021-03-22 MED ORDER — MELOXICAM 15 MG PO TABS: 15.0000 mg | ORAL_TABLET | Freq: Every day | ORAL | 0 refills | Status: DC

## 2021-03-22 NOTE — Patient Instructions (Signed)
-   Obtain x-rays ?- Start daily meloxicam (take with food) ?- Establish with psychiatry ?- Follow-up in 4 weeks ?- Contact for questions ?

## 2021-03-25 NOTE — Telephone Encounter (Signed)
For your information  

## 2021-03-26 ENCOUNTER — Encounter: Payer: Self-pay | Admitting: Family Medicine

## 2021-03-26 NOTE — Assessment & Plan Note (Signed)
Chronic, uncontrolled issues. Has had history of bilateral knee arthroscopy. Examination most consistent with tricompartmental osteoarthritis and treatments reviewed but surgical and non-surgical. He is requesting to proceed in a nonsurgical manner, plan for updated x-rays, orders placed today, initiation of meloxicam, and we will coordinate follow-up. ?

## 2021-03-26 NOTE — Telephone Encounter (Signed)
For your information  

## 2021-03-26 NOTE — Assessment & Plan Note (Signed)
Chronic, stable. Feels symptoms have improved though not resolved, having outbursts with less frequency. He is amenable to following with psychiatry, we will attempt to help him coordinate a visit. ?

## 2021-03-26 NOTE — Progress Notes (Signed)
?  ? ?  Primary Care / Sports Medicine Office Visit ? ?Patient Information:  ?Patient ID: Troy Dunn, male DOB: [birth year] Age: 51 y.o. MRN: [id number]  ? ?Troy Dunn is a pleasant 51 y.o. male presenting with the following: ? ?Chief Complaint  ?Patient presents with  ? Follow-up  ? ? ?Vitals:  ? 03/22/21 0802  ?BP: 120/70  ?Pulse: 71  ?SpO2: 99%  ? ?Vitals:  ? 03/22/21 0802  ?Weight: 190 lb (86.2 kg)  ?Height: 6\' 1"  (1.854 m)  ? ?Body mass index is 25.07 kg/m?. ? ?No results found.  ? ?Independent interpretation of notes and tests performed by another provider:  ? ?None ? ?Procedures performed:  ? ?None ? ?Pertinent History, Exam, Impression, and Recommendations:  ? ?Bipolar affective disorder, currently manic, moderate ([place]) ?Chronic, stable. Feels symptoms have improved though not resolved, having outbursts with less frequency. He is amenable to following with psychiatry, we will attempt to help him coordinate a visit. ? ?Primary osteoarthritis of knees, bilateral ?Chronic, uncontrolled issues. Has had history of bilateral knee arthroscopy. Examination most consistent with tricompartmental osteoarthritis and treatments reviewed but surgical and non-surgical. He is requesting to proceed in a nonsurgical manner, plan for updated x-rays, orders placed today, initiation of meloxicam, and we will coordinate follow-up.  ? ?Orders & Medications ?Meds ordered this encounter  ?Medications  ? meloxicam (MOBIC) 15 MG tablet  ?  Sig: Take 1 tablet (15 mg total) by mouth daily.  ?  Dispense:  30 tablet  ?  Refill:  0  ? ?Orders Placed This Encounter  ?Procedures  ? DG Knee Complete 4 Views Left  ? DG Knee Complete 4 Views Right  ?  ? ?Return in about 4 weeks (around 04/19/2021).  ?  ? ?Montel Culver, MD ? ? Primary Care Sports Medicine ?[facility] ?[place]  ? ?

## 2021-04-10 ENCOUNTER — Ambulatory Visit
Admission: RE | Admit: 2021-04-10 | Discharge: 2021-04-10 | Disposition: A | Discharge status: Home / Self Care | Payer: 59 | Attending: Family Medicine | Admitting: Family Medicine

## 2021-04-10 ENCOUNTER — Ambulatory Visit
Admission: RE | Admit: 2021-04-10 | Discharge: 2021-04-10 | Disposition: A | Discharge status: Home / Self Care | Payer: 59 | Source: Ambulatory Visit | Attending: Family Medicine | Admitting: Family Medicine

## 2021-04-10 DIAGNOSIS — M170 Bilateral primary osteoarthritis of knee: Secondary | ICD-10-CM

## 2021-04-18 ENCOUNTER — Encounter: Payer: Self-pay | Admitting: Family Medicine

## 2021-04-19 ENCOUNTER — Encounter: Payer: Self-pay | Admitting: Family Medicine

## 2021-04-19 ENCOUNTER — Inpatient Hospital Stay (INDEPENDENT_AMBULATORY_CARE_PROVIDER_SITE_OTHER): Payer: 59 | Admitting: Radiology

## 2021-04-19 ENCOUNTER — Ambulatory Visit (INDEPENDENT_AMBULATORY_CARE_PROVIDER_SITE_OTHER): Payer: 59 | Admitting: Family Medicine

## 2021-04-19 VITALS — BP 120/64 | HR 100 | Ht 73.0 in | Wt 185.0 lb

## 2021-04-19 DIAGNOSIS — M1712 Unilateral primary osteoarthritis, left knee: Secondary | ICD-10-CM | POA: Diagnosis not present

## 2021-04-19 DIAGNOSIS — M1711 Unilateral primary osteoarthritis, right knee: Secondary | ICD-10-CM

## 2021-04-19 MED ORDER — TRIAMCINOLONE ACETONIDE 40 MG/ML IJ SUSP
80.0000 mg | Freq: Once | INTRAMUSCULAR | Status: AC
  Administered 2021-04-19: 80 mg via INTRAMUSCULAR

## 2021-04-19 NOTE — Patient Instructions (Signed)
You have just been given a cortisone injection to reduce pain and inflammation. After the injection you may notice immediate relief of pain as a result of the Lidocaine. It is important to rest the area of the injection for 24 to 48 hours after the injection. There is a possibility of some temporary increased discomfort and swelling for up to 72 hours until the cortisone begins to work. If you do have pain, simply rest the joint and use ice. If you can tolerate over the counter medications, you can try Tylenol, Aleve, or Advil for added relief per package instructions. ?-Transition to as needed dosing of meloxicam until symptoms respond to cortisone ?- Our office will contact you once we obtain authorization for gel injections ?

## 2021-04-19 NOTE — Assessment & Plan Note (Signed)
Patient presents for follow-up to chronic bilateral knee pain, right greater than left, anteromedial location bilaterally, aggravated primarily with bending/straightening.  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  He did not get a chance to dose meloxicam but plans to do so once he picks this up, he was out of town. ? ?Given his radiographic findings which demonstrates tricompartmental osteoarthritis bilaterally, persistence of symptoms, we did discuss plan for meloxicam, corticosteroid, viscosupplementation.  Patient has requested to proceed with ultrasound-guided bilateral intra-articular corticosteroid knee injections, tolerated procedures well.  Post care reviewed.  From a medication management standpoint I have advised him to try meloxicam but does this on an as-needed basis until symptoms respond to cortisone.  Additionally, we will seek authorization for bilateral viscosupplementation and coordinate a follow-up accordingly. ? ?Chronic condition, exacerbation, independent interpretation x-ray, Rx management ?

## 2021-04-19 NOTE — Assessment & Plan Note (Signed)
See additional assessment(s) for plan details. 

## 2021-04-19 NOTE — Progress Notes (Signed)
?  ? ?Primary Care / Sports Medicine Office Visit ? ?Patient Information:  ?Patient ID: Troy Dunn, male DOB: 1970-11-29 Age: 51 y.o. MRN: [id number]  ? ?Troy Dunn is a pleasant 51 y.o. male presenting with the following: ? ?Chief Complaint  ?Patient presents with  ? osteoarthritis knees  ? ? ?Vitals:  ? 04/19/21 1430  ?BP: 120/64  ?Pulse: 100  ?SpO2: 97%  ? ?Vitals:  ? 04/19/21 1430  ?Weight: 185 lb (83.9 kg)  ?Height: 6\' 1"  (1.854 m)  ? ?Body mass index is 24.41 kg/m?. ? ?DG Knee Complete 4 Views Left ? ?Result Date: 04/13/2021 ?CLINICAL DATA:  Chronic bilateral knee pain, greater laterally. Previous bilateral knee surgery. EXAM: LEFT KNEE - COMPLETE 4+ VIEW COMPARISON:  None. FINDINGS: Mild-to-moderate medial, lateral and patellofemoral spur formation. Small to moderate-sized effusion. IMPRESSION: Tricompartmental degenerative changes and associated effusion. Electronically Signed   By: 04/15/2021 M.D.   On: 04/13/2021 13:19  ? ?DG Knee Complete 4 Views Right ? ?Result Date: 04/13/2021 ?CLINICAL DATA:  Chronic bilateral knee pain, most pronounced laterally. History of previous bilateral knee surgery. EXAM: RIGHT KNEE - COMPLETE 4+ VIEW COMPARISON:  None. FINDINGS: Mild lateral and patellofemoral spur formation minimal medial spur formation. Mild tibial spine spur formation. Small oval bony protrusion from the distal aspect of the lateral femoral condyle on the frontal view. No effusion seen. IMPRESSION: Mild degenerative changes. Electronically Signed   By: 04/15/2021 M.D.   On: 04/13/2021 13:21    ? ?Independent interpretation of notes and tests performed by another provider:  ? ?Independent interpretation of bilateral knee x-rays dated 04/13/2021 reveals tricompartmental degenerative changes, left knee greater than right, with focality to the patellofemoral and medial tibiofemoral compartments primarily, no significant patellar tilt noted or malalignment, no acute osseous processes  noted. ? ?Procedures performed:  ? ?Procedure:  Injection of right knee joint under ultrasound guidance. ?Ultrasound guidance utilized for out of plane anteromedial approach, joint space visualized, no effusion noted ?Samsung HS60 device utilized with permanent recording / reporting. ?Verbal informed consent obtained and verified. ?Skin prepped in a sterile fashion. ?Ethyl chloride for topical local analgesia.  ?Completed without difficulty and tolerated well. ?Medication: triamcinolone acetonide 40 mg/mL suspension for injection 1 mL total and 2 mL lidocaine 1% without epinephrine utilized for needle placement anesthetic ?Advised to contact for fevers/chills, erythema, induration, drainage, or persistent bleeding. ? ?Procedure:  Injection of left knee joint under ultrasound guidance. ?Ultrasound guidance utilized for out of plane anteromedial approach, no effusion noted ?Samsung HS60 device utilized with permanent recording / reporting. ?Verbal informed consent obtained and verified. ?Skin prepped in a sterile fashion. ?Ethyl chloride for topical local analgesia.  ?Completed without difficulty and tolerated well. ?Medication: triamcinolone acetonide 40 mg/mL suspension for injection 1 mL total and 2 mL lidocaine 1% without epinephrine utilized for needle placement anesthetic ?Advised to contact for fevers/chills, erythema, induration, drainage, or persistent bleeding. ? ? ?Pertinent History, Exam, Impression, and Recommendations:  ? ?Primary osteoarthritis of right knee ?Patient presents for follow-up to chronic bilateral knee pain, right greater than left, anteromedial location bilaterally, aggravated primarily with bending/straightening.  He works in 04/15/2021 and often has to be in a kneeling position, has noted intermittent swelling and progressive pain over the past several weeks.  He did not get a chance to dose meloxicam but plans to do so once he picks this up, he was out of town. ? ?Given his  radiographic findings which demonstrates tricompartmental osteoarthritis bilaterally, persistence of symptoms,  we did discuss plan for meloxicam, corticosteroid, viscosupplementation.  Patient has requested to proceed with ultrasound-guided bilateral intra-articular corticosteroid knee injections, tolerated procedures well.  Post care reviewed.  From a medication management standpoint I have advised him to try meloxicam but does this on an as-needed basis until symptoms respond to cortisone.  Additionally, we will seek authorization for bilateral viscosupplementation and coordinate a follow-up accordingly. ? ?Chronic condition, exacerbation, independent interpretation x-ray, Rx management ? ?Primary osteoarthritis of left knee ?See additional assessment(s) for plan details.  ? ?Orders & Medications ?Meds ordered this encounter  ?Medications  ? triamcinolone acetonide (KENALOG-40) injection 80 mg  ? ?Orders Placed This Encounter  ?Procedures  ? [place] LIMITED JOINT SPACE STRUCTURES LOW BILAT  ?  ? ?No follow-ups on file.  ?  ? ?Jerrol Banana, MD ? ? Primary Care Sports Medicine ?[facility] ?[facility]  ? ?

## 2021-05-13 ENCOUNTER — Ambulatory Visit (INDEPENDENT_AMBULATORY_CARE_PROVIDER_SITE_OTHER): Payer: Commercial Managed Care - PPO | Admitting: Psychiatry

## 2021-05-13 ENCOUNTER — Encounter: Payer: Self-pay | Admitting: Psychiatry

## 2021-05-13 VITALS — BP 142/86 | HR 67 | Ht 73.0 in | Wt 180.8 lb

## 2021-05-13 DIAGNOSIS — F39 Unspecified mood [affective] disorder: Secondary | ICD-10-CM | POA: Diagnosis not present

## 2021-05-13 DIAGNOSIS — F1290 Cannabis use, unspecified, uncomplicated: Secondary | ICD-10-CM | POA: Diagnosis not present

## 2021-05-13 DIAGNOSIS — G4700 Insomnia, unspecified: Secondary | ICD-10-CM

## 2021-05-13 DIAGNOSIS — Z79899 Other long term (current) drug therapy: Secondary | ICD-10-CM | POA: Diagnosis not present

## 2021-05-13 MED ORDER — HYDROXYZINE PAMOATE 25 MG PO CAPS: 25.0000 mg | ORAL_CAPSULE | Freq: Two times a day (BID) | ORAL | 1 refills | Status: DC | PRN

## 2021-05-13 MED ORDER — DIVALPROEX SODIUM ER 500 MG PO TB24: 500.0000 mg | ORAL_TABLET | Freq: Every day | ORAL | 0 refills | Status: DC
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
## 2021-05-13 NOTE — Patient Instructions (Signed)
www.[email or web address].org ? ?www.psychologytoday ?Hydroxyzine Capsules or Tablets ?What is this medication? ?HYDROXYZINE (hye DROX i zeen) treats the symptoms of allergies and allergic reactions. It may also be used to treat anxiety or cause drowsiness before a procedure. It works by blocking histamine, a substance released by the body during an allergic reaction. It belongs to a group of medications called antihistamines. ?This medicine may be used for other purposes; ask your health care provider or pharmacist if you have questions. ?COMMON BRAND NAME(S): ANX, Atarax, Rezine, Vistaril ?What should I tell my care team before I take this medication? ?They need to know if you have any of these conditions: ?Glaucoma ?Heart disease ?History of irregular heartbeat ?Kidney disease ?Liver disease ?Lung or breathing disease, like asthma ?Stomach or intestine problems ?Thyroid disease ?Trouble passing urine ?An unusual or allergic reaction to hydroxyzine, cetirizine, other medications, foods, dyes or preservatives ?Pregnant or trying to get pregnant ?Breast-feeding ?How should I use this medication? ?Take this medication by mouth with a full glass of water. Follow the directions on the prescription label. You may take this medication with food or on an empty stomach. Take your medication at regular intervals. Do not take your medication more often than directed. ?Talk to your care team regarding the use of this medication in children. Special care may be needed. While this medication may be prescribed for children as young as 466 years of age for selected conditions, precautions do apply. ?Patients over 326 years old may have a stronger reaction and need a smaller dose. ?Overdosage: If you think you have taken too much of this medicine contact a poison control center or emergency room at once. ?NOTE: This medicine is only for you. Do not share this medicine with others. ?What if I miss a dose? ?If you miss a dose, take it as  soon as you can. If it is almost time for your next dose, take only that dose. Do not take double or extra doses. ?What may interact with this medication? ?Do not take this medication with any of the following: ?Cisapride ?Dronedarone ?Pimozide ?Thioridazine ?This medication may also interact with the following: ?Alcohol ?Antihistamines for allergy, cough, and cold ?Atropine ?Barbiturate medications for sleep or seizures, like phenobarbital ?Certain antibiotics like erythromycin or clarithromycin ?Certain medications for anxiety or sleep ?Certain medications for bladder problems like oxybutynin, tolterodine ?Certain medications for depression or psychotic disturbances ?Certain medications for irregular heart beat ?Certain medications for Parkinson's disease like benztropine, trihexyphenidyl ?Certain medications for seizures like phenobarbital, primidone ?Certain medications for stomach problems like dicyclomine, hyoscyamine ?Certain medications for travel sickness like scopolamine ?Ipratropium ?Narcotic medications for pain ?Other medications that prolong the QT interval (which can cause an abnormal heart rhythm) like dofetilide ?This list may not describe all possible interactions. Give your health care provider a list of all the medicines, herbs, non-prescription drugs, or dietary supplements you use. Also tell them if you smoke, drink alcohol, or use illegal drugs. Some items may interact with your medicine. ?What should I watch for while using this medication? ?Tell your care team if your symptoms do not improve. ?You may get drowsy or dizzy. Do not drive, use machinery, or do anything that needs mental alertness until you know how this medication affects you. Do not stand or sit up quickly, especially if you are an older patient. This reduces the risk of dizzy or fainting spells. Alcohol may interfere with the effect of this medication. Avoid alcoholic drinks. ?Your mouth may get dry. Chewing sugarless gum  or  sucking hard candy, and drinking plenty of water may help. Contact your care team if the problem does not go away or is severe. ?This medication may cause dry eyes and blurred vision. If you wear contact lenses you may feel some discomfort. Lubricating drops may help. See your eye care specialist if the problem does not go away or is severe. ?If you are receiving skin tests for allergies, tell your care team you are using this medication. ?What side effects may I notice from receiving this medication? ?Side effects that you should report to your care team as soon as possible: ?Allergic reactions--skin rash, itching, hives, swelling of the face, lips, tongue, or throat ?Heart rhythm changes--fast or irregular heartbeat, dizziness, feeling faint or lightheaded, chest pain, trouble breathing ?Side effects that usually do not require medical attention (report to your care team if they continue or are bothersome): ?Confusion ?Drowsiness ?Dry mouth ?Hallucinations ?Headache ?This list may not describe all possible side effects. Call your doctor for medical advice about side effects. You may report side effects to FDA at 1-800-FDA-1088. ?Where should I keep my medication? ?Keep out of the reach of children and pets. ?Store at room temperature between 15 and 30 degrees C (59 and 86 degrees F). Keep container tightly closed. Throw away any unused medication after the expiration date. ?NOTE: This sheet is a summary. It may not cover all possible information. If you have questions about this medicine, talk to your doctor, pharmacist, or health care provider. ?? 2023 Elsevier/Gold Standard (2020-03-21 00:00:00) ?Divalproex Sodium Delayed- or Extended-Release Tablets ?What is this medication? ?DIVALPROEX SODIUM (dye VAL pro ex SO dee um) prevents and controls seizures in people with epilepsy. It may also be used to prevent migraine headaches. It can also be used to treat bipolar disorder. It works by calming overactive nerves in  your body. ?This medicine may be used for other purposes; ask your health care provider or pharmacist if you have questions. ?COMMON BRAND NAME(S): Depakote, Depakote ER ?What should I tell my care team before I take this medication? ?They need to know if you have any of these conditions: ?Frequently drink alcohol ?Kidney disease ?Liver disease ?Low platelet counts ?Mitochondrial disease ?Suicidal thoughts, plans, or attempt by you or a family member ?Urea cycle disorder (UCD) ?An unusual or allergic reaction to divalproex sodium, sodium valproate, valproic acid, other medications, foods, dyes, or preservatives ?Pregnant or trying to get pregnant ?Breast-feeding ?How should I use this medication? ?Take this medication by mouth with a drink of water. Follow the directions on the prescription label. Do not cut, crush or chew this medication. You can take it with or without food. If it upsets your stomach, take it with food. Take your medication at regular intervals. Do not take it more often than directed. Do not stop taking except on your care team's advice. ?A special MedGuide will be given to you by the pharmacist with each prescription and refill. Be sure to read this information carefully each time. ?Talk to your care team about the use of this medication in children. While this medication may be prescribed for children as young as 10 years for selected conditions, precautions do apply. ?Overdosage: If you think you have taken too much of this medicine contact a poison control center or emergency room at once. ?NOTE: This medicine is only for you. Do not share this medicine with others. ?What if I miss a dose? ?If you miss a dose, take it as soon as  you can. If it is almost time for your next dose, take only that dose. Do not take double or extra doses. ?What may interact with this medication? ?Do not take this medication with any of the following: ?Sodium phenylbutyrate ?This medication may also interact with  the following: ?Aspirin ?Certain antibiotics like ertapenem, imipenem, meropenem ?Certain medications for depression, anxiety, or mental health conditions ?Certain medications for seizures like [place]

## 2021-05-14 ENCOUNTER — Encounter: Payer: Self-pay | Admitting: Psychiatry

## 2021-05-16 ENCOUNTER — Telehealth: Payer: Self-pay | Admitting: *Deleted

## 2021-05-16 ENCOUNTER — Telehealth: Payer: Self-pay | Admitting: Psychiatry

## 2021-05-16 NOTE — Telephone Encounter (Signed)
If he is having side effects please ask him to stop taking it.  ?Please ask him to wait couple of days and once he feels better  we could discuss other medications. ?Please ask him to get immediate help at an urgent care or ED if he is having severe side effects. ?

## 2021-05-16 NOTE — Telephone Encounter (Signed)
PATIENT CALLED STATED THAT THE NEWLY PRESCRIBED MEDICATION:  ?hydrOXYzine (VISTARIL) 25 MG capsule ?MADE HIM ILL THAT HE VOMITED IT BACK UP. ? PATIENT STATED HE HAD BEEN & WAS ALREADY HAVING ANXIETY & WAITED 2 HR BEFORE TAKING MEDICATION  & ONCE HE DID  THAT'S WHEN THE VOMITING HAPPENED. ?PATIENT REQUESTED CALL FOR DIRECTION ON WHAT TO DO ?

## 2021-05-16 NOTE — Telephone Encounter (Signed)
Contacted patient to discuss his concerns with medication. ? ?Patient does not know if the hydroxyzine caused him to have vomiting or was it a panic attack. ? ?Patient is interested in trying the hydroxyzine once more if he needs it again. ? ?He is currently looking for psychotherapist, encouraged him to do so. ? ?Patient could stop the hydroxyzine if he is not interested in continuing it. ? ?Patient advised to continue the Depakote. ? ?We will consider making more medication changes as needed in the future.  Patient agrees with plan. ?

## 2021-05-16 NOTE — Telephone Encounter (Signed)
Spoke to patient & informed per provider: If he is having side effects please ask him to stop taking it.  ?Please ask him to wait couple of days and once he feels better  we could discuss other medications. ?Please ask him to get immediate help at an urgent care or ED if he is having severe side effects. ?

## 2021-05-17 NOTE — Telephone Encounter (Signed)
Please review.  KP

## 2021-05-20 NOTE — Telephone Encounter (Signed)
Called pt scheduled appt for tomorrow 05/21/2021. ? ?KP ?

## 2021-05-21 ENCOUNTER — Ambulatory Visit (INDEPENDENT_AMBULATORY_CARE_PROVIDER_SITE_OTHER): Payer: Commercial Managed Care - PPO | Admitting: Family Medicine

## 2021-05-21 VITALS — BP 124/68 | HR 94 | Ht 73.0 in | Wt 178.0 lb

## 2021-05-21 DIAGNOSIS — M1712 Unilateral primary osteoarthritis, left knee: Secondary | ICD-10-CM

## 2021-05-21 DIAGNOSIS — M1711 Unilateral primary osteoarthritis, right knee: Secondary | ICD-10-CM

## 2021-05-21 DIAGNOSIS — M2392 Unspecified internal derangement of left knee: Secondary | ICD-10-CM

## 2021-05-21 MED ORDER — DICLOFENAC SODIUM 75 MG PO TBEC: 75.0000 mg | DELAYED_RELEASE_TABLET | Freq: Two times a day (BID) | ORAL | 0 refills | Status: DC | PRN

## 2021-05-21 NOTE — Progress Notes (Signed)
?  ? ?  Primary Care / Sports Medicine Office Visit ? ?Patient Information:  ?Patient ID: Troy Dunn, male DOB: 1970-05-28 Age: 51 y.o. MRN: [id number]  ? ?Troy Dunn is a pleasant 51 y.o. male presenting with the following: ? ?Chief Complaint  ?Patient presents with  ? Knee Pain  ?  Sharp pain for about 1 week. No swelling. Did fall from the pain.   ? ? ?Vitals:  ? 05/21/21 1500  ?BP: 124/68  ?Pulse: 94  ?SpO2: 100%  ? ?Vitals:  ? 05/21/21 1500  ?Weight: 178 lb (80.7 kg)  ?Height: 6\' 1"  (1.854 m)  ? ?Body mass index is 23.48 kg/m?. ? ?No results found.  ? ?Independent interpretation of notes and tests performed by another provider:  ? ?None ? ?Procedures performed:  ? ?None ? ?Pertinent History, Exam, Impression, and Recommendations:  ? ?Problem List Items Addressed This Visit   ? ?  ? Musculoskeletal and Integument  ? Primary osteoarthritis of right knee  ?  See additional assessment(s) for plan details. ? ?  ?  ? Relevant Medications  ? diclofenac (VOLTAREN) 75 MG EC tablet  ? Primary osteoarthritis of left knee - Primary  ?  Patient returns for follow-up to chronic bilateral knee pain, left greater than right, of note at last visit on 04/19/2021 he received bilateral cortisone injections which patient reports provided significant improvement in his symptomatology until the past week at which point he began to note left greater than right intermittent sharp pain compared to his previous more constant dull ache type pain.  He usually has the symptoms associated with swelling but has not noted any swelling.  Has had an episode of near buckling on his left knee.  He continues meloxicam daily scheduled.  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 ? ?Examination reveals 1+ effusion bilaterally, right knee nontender throughout and benign provocative testing.  Left knee with full painless range of motion, tenderness at the medial tibiofemoral articulation  and at the patellar facets, range of motion is full and painless, provocative testing is benign save for equivocal medial McMurray. ? ?Given his stated buckling/near buckling, recalcitrant symptomatology, and clinical findings today, plan for MRI of the left knee without contrast, interim usage of a hinged knee brace, and activity as tolerated.  Additionally we will transition from meloxicam to diclofenac on a as needed basis.  We will coordinate a follow-up once MRI is completed for review. ? ?Chronic condition, symptomatic, Rx management ? ?  ?  ? Relevant Medications  ? diclofenac (VOLTAREN) 75 MG EC tablet  ? Other Relevant Orders  ? MR Knee Left  Wo Contrast  ? ?Other Visit Diagnoses   ? ? Internal derangement of left knee      ? Relevant Orders  ? MR Knee Left  Wo Contrast  ? ?  ?  ? ?Orders & Medications ?Meds ordered this encounter  ?Medications  ? diclofenac (VOLTAREN) 75 MG EC tablet  ?  Sig: Take 1 tablet (75 mg total) by mouth 2 (two) times daily as needed.  ?  Dispense:  60 tablet  ?  Refill:  0  ? ?Orders Placed This Encounter  ?Procedures  ? MR Knee Left  Wo Contrast  ?  ? ?Return if symptoms worsen or fail to improve.  ?  ? ?Montel Culver, MD ? ? Primary Care Sports Medicine ?[facility] ?[place]  ? ?

## 2021-05-21 NOTE — Assessment & Plan Note (Signed)
See additional assessment(s) for plan details. 

## 2021-05-21 NOTE — Patient Instructions (Signed)
-   Use hinged knee brace at all times on your feet, okay to remove for periods of prolonged rest, sleep, bathing ?- Stop meloxicam and start diclofenac twice daily on as-needed basis (take with food, no other NSAIDs on this medication) ?- Scheduler contact you in regards to setting up MRI of the left knee ?- Conduct activity as tolerated using knee symptoms as a guide ?- Our office will contact you once results are available to coordinate follow-up and next steps ?- Reach out to [place] for any questions between now and then ?

## 2021-05-21 NOTE — Assessment & Plan Note (Signed)
Patient returns for follow-up to chronic bilateral knee pain, left greater than right, of note at last visit on 04/19/2021 he received bilateral cortisone injections which patient reports provided significant improvement in his symptomatology until the past week at which point he began to note left greater than right intermittent sharp pain compared to his previous more constant dull ache type pain.  He usually has the symptoms associated with swelling but has not noted any swelling.  Has had an episode of near buckling on his left knee.  He continues meloxicam daily scheduled.  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 ? ?Examination reveals 1+ effusion bilaterally, right knee nontender throughout and benign provocative testing.  Left knee with full painless range of motion, tenderness at the medial tibiofemoral articulation and at the patellar facets, range of motion is full and painless, provocative testing is benign save for equivocal medial McMurray. ? ?Given his stated buckling/near buckling, recalcitrant symptomatology, and clinical findings today, plan for MRI of the left knee without contrast, interim usage of a hinged knee brace, and activity as tolerated.  Additionally we will transition from meloxicam to diclofenac on a as needed basis.  We will coordinate a follow-up once MRI is completed for review. ? ?Chronic condition, symptomatic, Rx management ?

## 2021-05-29 ENCOUNTER — Ambulatory Visit
Admission: RE | Admit: 2021-05-29 | Discharge: 2021-05-29 | Disposition: A | Discharge status: Home / Self Care | Payer: Commercial Managed Care - PPO | Source: Ambulatory Visit | Attending: Family Medicine | Admitting: Family Medicine

## 2021-05-29 DIAGNOSIS — M1712 Unilateral primary osteoarthritis, left knee: Secondary | ICD-10-CM | POA: Insufficient documentation

## 2021-05-29 DIAGNOSIS — M2392 Unspecified internal derangement of left knee: Secondary | ICD-10-CM | POA: Insufficient documentation

## 2021-06-05 ENCOUNTER — Encounter: Payer: Self-pay | Admitting: Family Medicine

## 2021-06-06 NOTE — Telephone Encounter (Signed)
Please review.  KP

## 2021-06-07 ENCOUNTER — Other Ambulatory Visit: Payer: Self-pay

## 2021-06-07 DIAGNOSIS — G8929 Other chronic pain: Secondary | ICD-10-CM

## 2021-06-12 ENCOUNTER — Ambulatory Visit: Payer: Commercial Managed Care - PPO | Admitting: Psychiatry

## 2023-05-04 IMAGING — CR DG LUMBAR SPINE COMPLETE 4+V
5 series · 6 of 6 positions shown · non-contrast
Comparison: None.

CLINICAL DATA: Chronic back pain, left-sided pain

EXAM:
LUMBAR SPINE - COMPLETE 4+ VIEW

[l-spine ap]
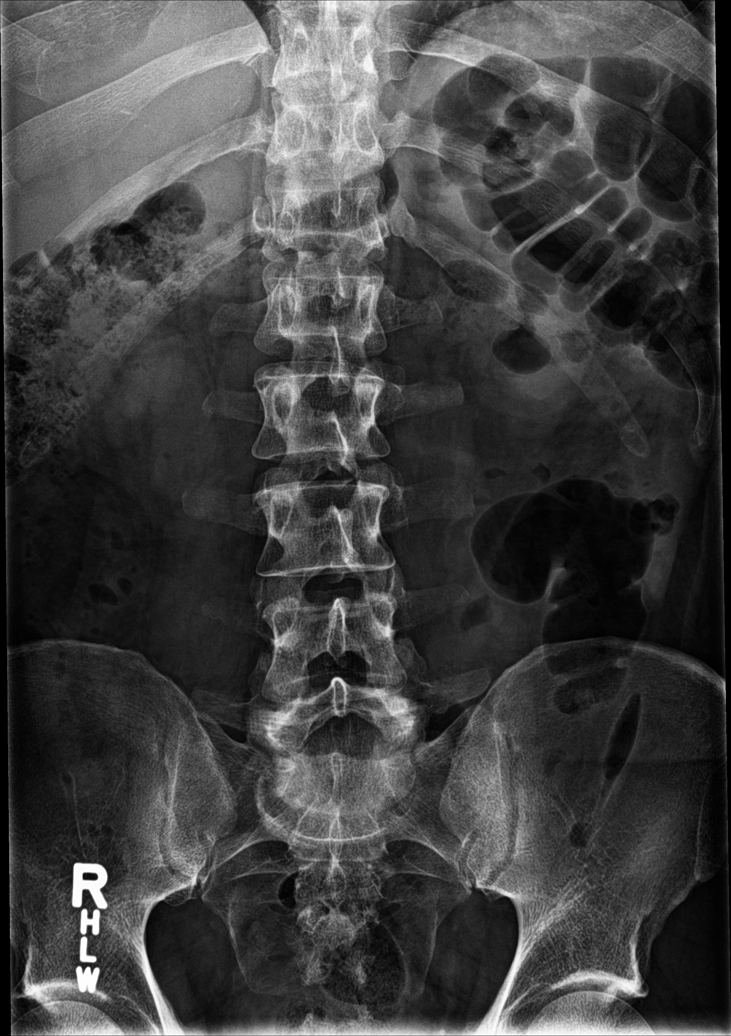

[l-spine obl (1 of 2)]
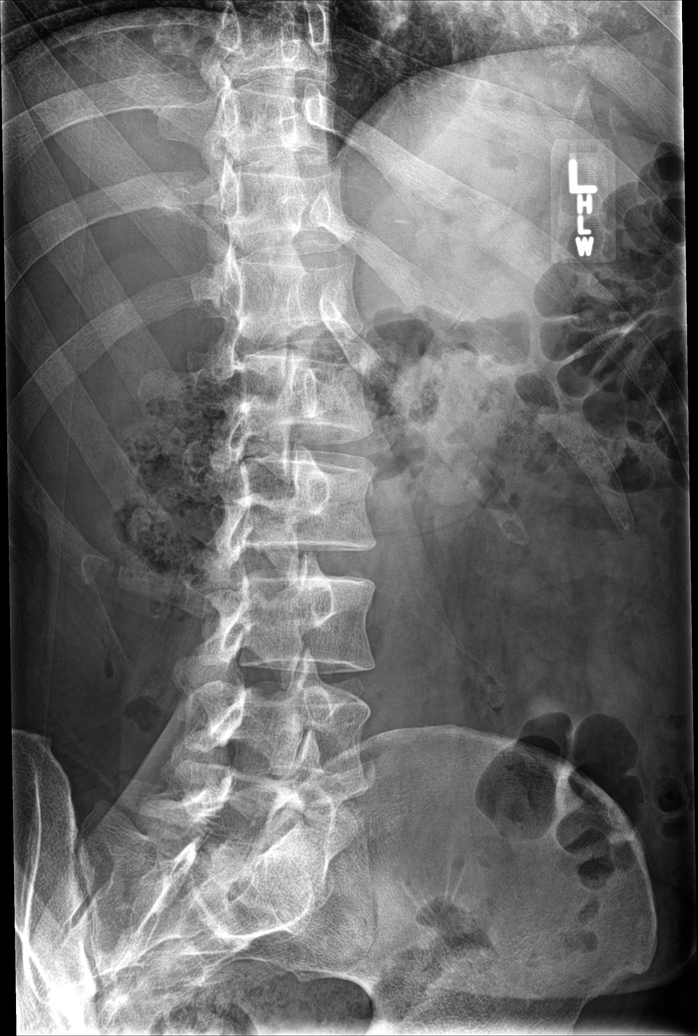

[Series 3: l-spine obl · 0.14mm/px · 2 of 2 slices shown (2 of 2)]
[im 1/2]
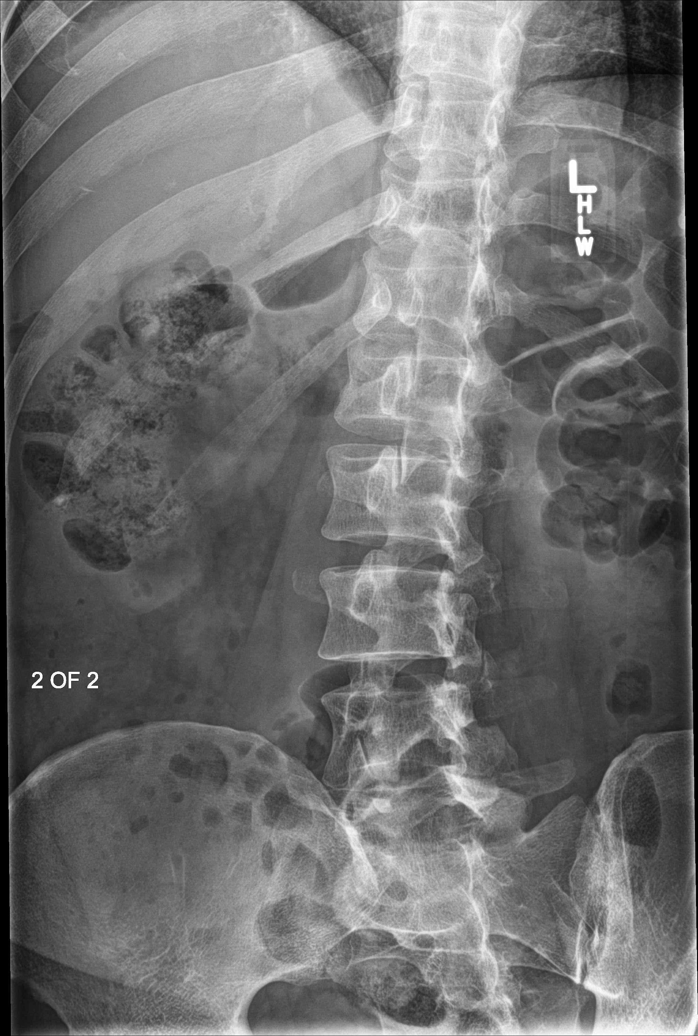
[im 2/2]
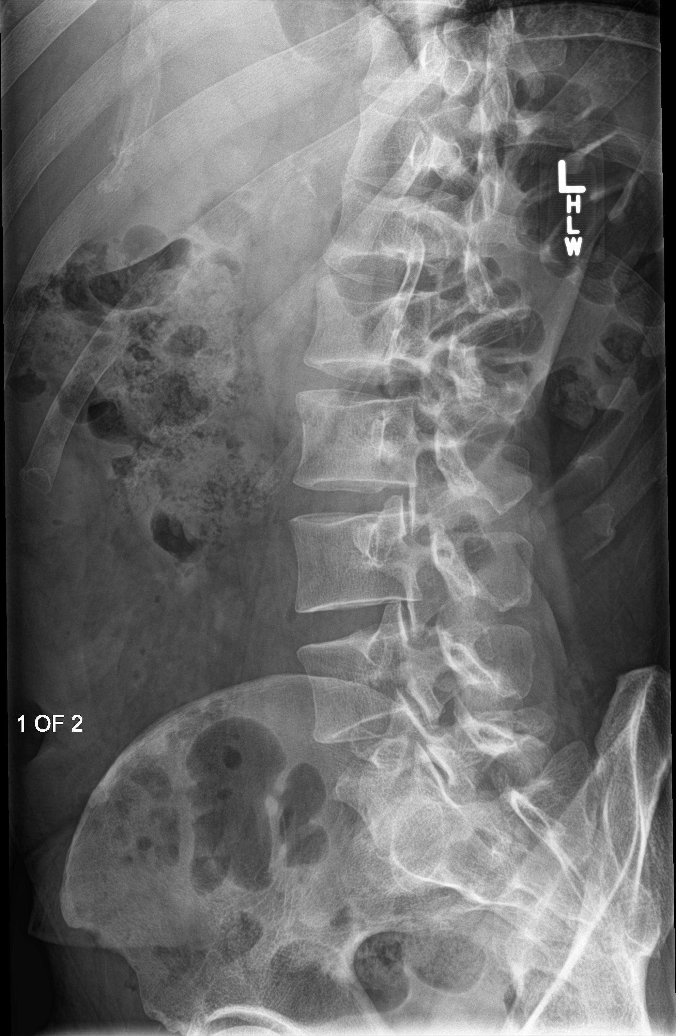

[l-spine lat]
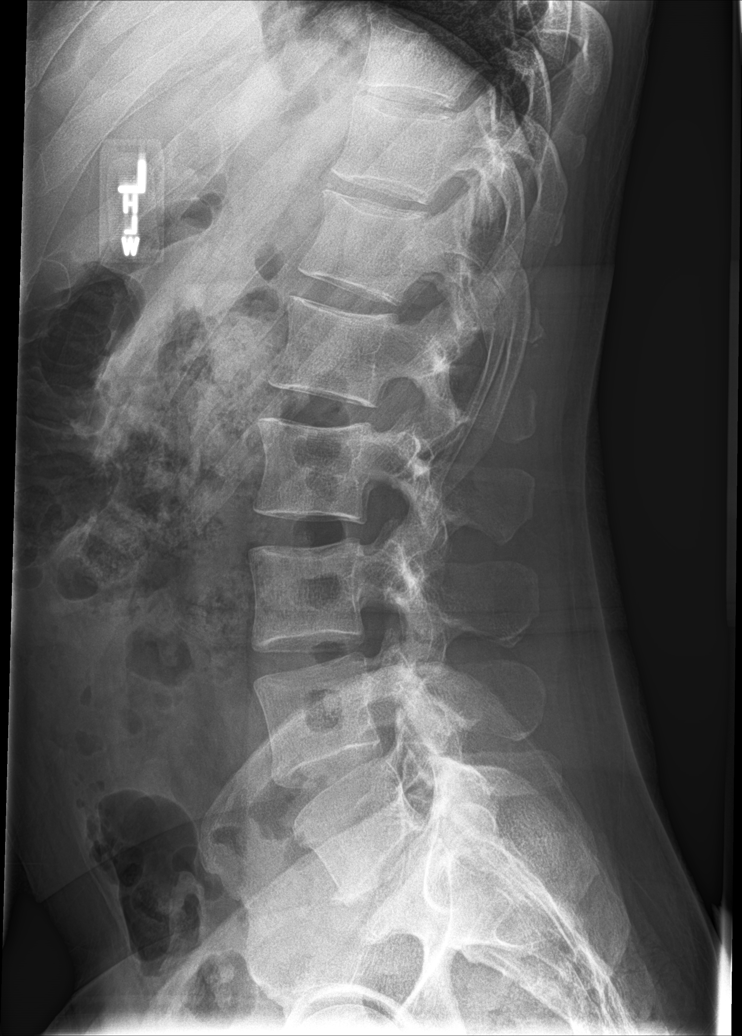

[l-spine spot]
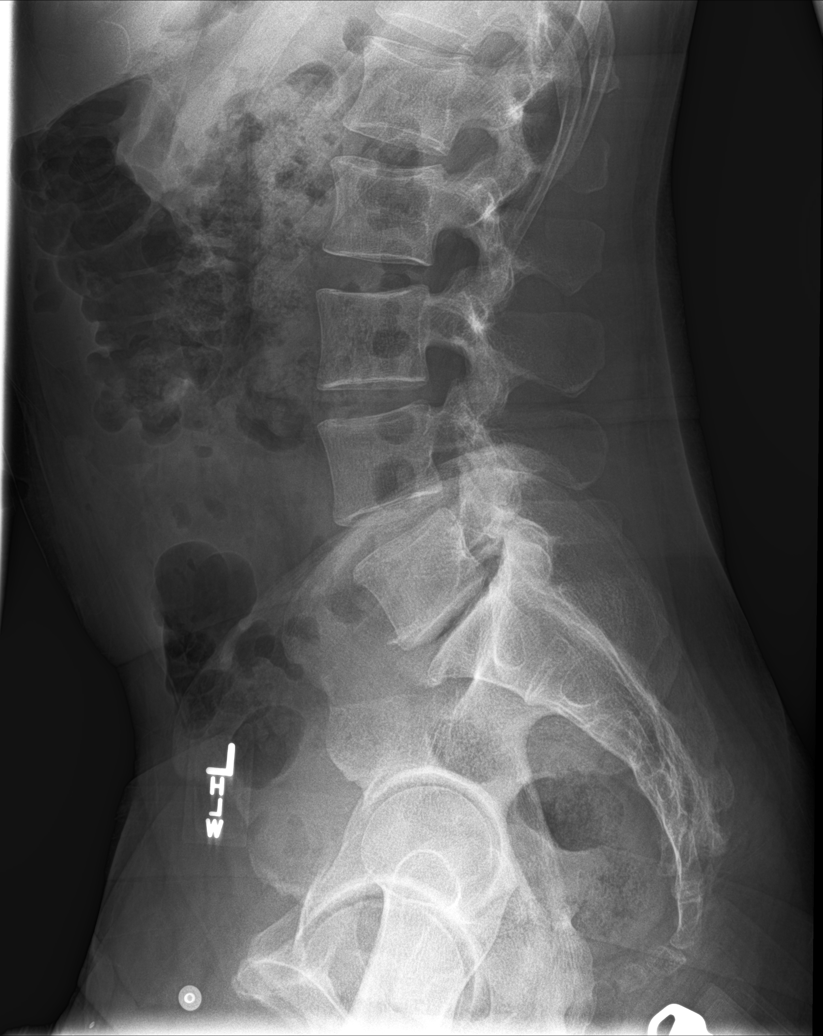

[6 of 6 positions shown; findings below may reference images not displayed]

FINDINGS: Frontal, bilateral oblique, lateral views of the lumbar spine are
obtained. There are 5 non-rib-bearing lumbar type vertebral bodies
identified in grossly normal anatomic alignment. No acute fractures.
Mild facet hypertrophy and spondylosis at the L4-5 and L5-S1 level.
Sacroiliac joints are unremarkable.
IMPRESSION: 1. Mild spondylosis and facet hypertrophy at the lumbosacral
junction.
2. No acute bony abnormality.

## 2023-09-07 ENCOUNTER — Emergency Department: Payer: Self-pay

## 2023-09-07 ENCOUNTER — Other Ambulatory Visit: Payer: Self-pay

## 2023-09-07 DIAGNOSIS — R0602 Shortness of breath: Secondary | ICD-10-CM | POA: Insufficient documentation

## 2023-09-07 DIAGNOSIS — R0789 Other chest pain: Secondary | ICD-10-CM | POA: Insufficient documentation

## 2023-09-07 LAB — BASIC METABOLIC PANEL WITH GFR
Anion gap: 9 (ref 5–15)
BUN: 20 mg/dL (ref 6–20)
CO2: 26 mmol/L (ref 22–32)
Calcium: 9.6 mg/dL (ref 8.9–10.3)
Chloride: 103 mmol/L (ref 98–111)
Creatinine, Ser: 1.17 mg/dL (ref 0.61–1.24)
GFR, Estimated: >60 mL/min (ref >60)
Glucose, Bld: 86 mg/dL (ref 70–99)
Potassium: 3.5 mmol/L (ref 3.5–5.1)
Sodium: 138 mmol/L (ref 135–145)

## 2023-09-07 LAB — CBC
HCT: 40.1 % (ref 39.0–52.0)
Hemoglobin: 14 g/dL (ref 13.0–17.0)
MCH: 32.4 pg (ref 26.0–34.0)
MCHC: 34.9 g/dL (ref 30.0–36.0)
MCV: 92.8 fL (ref 80.0–100.0)
Platelets: 258 K/uL (ref 150–400)
RBC: 4.32 MIL/uL (ref 4.22–5.81)
RDW: 12.1 % (ref 11.5–15.5)
WBC: 8.6 K/uL (ref 4.0–10.5)
nRBC: 0 % (ref 0.0–0.2)

## 2023-09-07 LAB — TROPONIN I (HIGH SENSITIVITY): Troponin I (High Sensitivity): 5 ng/L (ref <18)

## 2023-09-07 NOTE — ED Triage Notes (Signed)
 Pt reports chest pain over the past few days, nausea and weight loss. Pt reports today he was moving and chest pain became worse. Denies known cardiac hx.

## 2023-09-08 ENCOUNTER — Emergency Department
Admission: EM | Admit: 2023-09-08 | Discharge: 2023-09-08 | Disposition: A | Discharge status: Home / Self Care | Payer: Self-pay | Attending: Emergency Medicine | Admitting: Emergency Medicine

## 2023-09-08 DIAGNOSIS — R079 Chest pain, unspecified: Secondary | ICD-10-CM

## 2023-09-08 LAB — HEPATIC FUNCTION PANEL
ALT: 18 U/L (ref 0–44)
AST: 21 U/L (ref 15–41)
Albumin: 4.3 g/dL (ref 3.5–5.0)
Alkaline Phosphatase: 46 U/L (ref 38–126)
Bilirubin, Direct: <0.1 mg/dL (ref 0.0–0.2)
Total Bilirubin: 1.1 mg/dL (ref 0.0–1.2)
Total Protein: 7.2 g/dL (ref 6.5–8.1)

## 2023-09-08 LAB — D-DIMER, QUANTITATIVE: D-Dimer, Quant: <0.27 ug{FEU}/mL (ref 0.00–0.50)

## 2023-09-08 LAB — TROPONIN I (HIGH SENSITIVITY): Troponin I (High Sensitivity): 5 ng/L (ref <18)

## 2023-09-08 LAB — LIPASE, BLOOD: Lipase: 40 U/L (ref 11–51)

## 2023-09-08 MED ORDER — KETOROLAC TROMETHAMINE 30 MG/ML IJ SOLN
30.0000 mg | Freq: Once | INTRAMUSCULAR | Status: AC
  Administered 2023-09-08: 30 mg via INTRAVENOUS

## 2023-09-08 MED ORDER — ACETAMINOPHEN 500 MG PO TABS
1000.0000 mg | ORAL_TABLET | Freq: Once | ORAL | Status: AC
  Administered 2023-09-08: 1000 mg via ORAL
  Filled 2023-09-08: qty 2

## 2023-09-08 MED ORDER — KETOROLAC TROMETHAMINE 30 MG/ML IJ SOLN
30.0000 mg | Freq: Once | INTRAMUSCULAR | Status: DC
  Filled 2023-09-08: qty 1

## 2023-09-08 NOTE — ED Provider Notes (Signed)
 [facility] Provider Note    Event Date/Time   First MD Initiated Contact with Patient 09/08/23 [phone]     (approximate)   History   Chest Pain   HPI  Troy Dunn is a 53 y.o. male   Past medical history of hyperlipidemia and no personal history of cardiac disease but with significant family history of heart attacks, presents emergency department with exertional left-sided chest pain.  Who is moving some small boxes, and went up and down the stairs a lot of times today, in the middle of moving developed left-sided chest pressure bubblelike sensation in the left chest.  Nonradiating.  Associate with some shortness of breath.  Better with rest.  Worse with movement.  No direct trauma.  No respiratory infectious symptoms.  No history of blood clot immobilization or leg pain or swelling.  He has significant family history of cardiac disease.  Review of systems reveals occasional nausea, and nausea associated with his chest pain earlier today.  He denies abdominal pain.  He has felt that he tires more easily and gets short of breath easier in the last several weeks than he normally would expect to.   External Medical Documents Reviewed: Outpatient notes from earlier this year, primary doctor visit in May      Physical Exam   Triage Vital Signs: ED Triage Vitals  Encounter Vitals Group     BP 09/07/23 2228 (!) 150/82     Girls Systolic BP Percentile --      Girls Diastolic BP Percentile --      Boys Systolic BP Percentile --      Boys Diastolic BP Percentile --      Pulse Rate 09/07/23 2228 76     Resp 09/07/23 2228 17     Temp 09/07/23 2228 97.9 F (36.6 C)     Temp Source 09/07/23 2228 Oral     SpO2 09/07/23 2228 100 %     Weight 09/07/23 2227 150 lb (68 kg)     Height 09/07/23 2227 6' 1 (1.854 m)     Head Circumference --      Peak Flow --      Pain Score 09/07/23 2227 0     Pain Loc --      Pain Education --      Exclude from  Growth Chart --     Most recent vital signs: Vitals:   09/07/23 2228 09/08/23 0100  BP: (!) 150/82 135/84  Pulse: 76 60  Resp: 17 15  Temp: 97.9 F (36.6 C)   SpO2: 100% 100%    General: Awake, no distress.  CV:  Good peripheral perfusion.  Resp:  Normal effort.  Abd:  No distention.  Other:  Pleasant gentleman no acute distress with normal vital signs breathing comfortably on room air.  Clear lungs to auscultation normal heart sounds rate and rhythm and no murmur.  Soft benign abdominal exam to deep palpation all quadrants.  Skin appears warm and well-perfused.  No significant chest wall tenderness to palpation.   ED Results / Procedures / Treatments   Labs (all labs ordered are listed, but only abnormal results are displayed) Labs Reviewed  BASIC METABOLIC PANEL WITH GFR  CBC  HEPATIC FUNCTION PANEL  LIPASE, BLOOD  D-DIMER, QUANTITATIVE  TROPONIN I (HIGH SENSITIVITY)  TROPONIN I (HIGH SENSITIVITY)     I ordered and reviewed the above labs they are notable for initial troponin normal, cell counts electrolytes unremarkable  EKG  ED ECG REPORT I, Ginnie Shams, the attending physician, personally viewed and interpreted this ECG.   Date: 09/08/2023  EKG Time: 2228  Rate: 72  Rhythm: sinus  Axis: nl  Intervals:nl  ST&T Change: no stemi    RADIOLOGY I independently reviewed and interpreted chest x-ray and I see no obvious focality pneumothorax I also reviewed radiologist's formal read.   PROCEDURES:  Critical Care performed: No  Procedures   MEDICATIONS ORDERED IN ED: Medications  acetaminophen  (TYLENOL ) tablet 1,000 mg (1,000 mg Oral Given 09/08/23 0105)  ketorolac  (TORADOL ) 30 MG/ML injection 30 mg (30 mg Intravenous Given 09/08/23 0108)      IMPRESSION / MDM / ASSESSMENT AND PLAN / ED COURSE  I reviewed the triage vital signs and the nursing notes.                                Patient's presentation is most consistent with acute presentation  with potential threat to life or bodily function.  Differential diagnosis includes, but is not limited to, ACS, PE, dissection, respiratory infection, pneumothorax, costochondritis   The patient is on the cardiac monitor to evaluate for evidence of arrhythmia and/or significant heart rate changes.  MDM:    Most likely costochondritis as he has had pain with movement, with significant exertion today picking up boxes walking up and down stairs.  I did consider ACS as a possibility given the exertional nature of his left-sided chest pain with significant family history of cardiac disease.  Fortunately EKG is nonischemic and the first troponin is negative.  I think important to get second troponin given his age, family history, and relatively recent onset of pain.  Considered PE as well given his associated shortness of breath, start with D-dimer as low to moderate clinical probability.  Considered upper abdominal pathologies like pancreatitis or biliary pathologies but with a very benign abdominal exam normal LFTs and lipase I think this is very unlikely.  He has lost touch with his primary doctor, and had cardiac testing years ago and has lost touch with the cardiologist.  I will make a referral for both.  Ultimately patient is stable and asymptomatic now if dimer/PE workup and serial troponin negative I think he can be discharged and follow-up with PMD/cardiology.       FINAL CLINICAL IMPRESSION(S) / ED DIAGNOSES   Final diagnoses:  Nonspecific chest pain     Rx / DC Orders   ED Discharge Orders          Ordered    Ambulatory referral to Cardiology       Comments: If you have not heard from the Cardiology office within the next 72 hours please call [phone].   09/08/23 0128    Ambulatory Referral to Primary Care (Establish Care)        09/08/23 0128             Note:  This document was prepared using Dragon voice recognition software and may include unintentional  dictation errors.    Shams Ginnie, MD 09/08/23 [phone]

## 2023-09-08 NOTE — Discharge Instructions (Addendum)
 Fortunately your evaluation in the Emergency Department did not show any evidence of life-threatening emergencies tonight like heart attack, blood clot, punctured lungs or infections in your lung.  Thank you for choosing us  for your health care today!  Please see your primary doctor this week for a follow up appointment.   If you have any new, worsening, or unexpected symptoms call your doctor right away or come back to the emergency department for reevaluation.  It was my pleasure to care for you today.   Ginnie EDISON Cyrena, MD

## 2023-09-11 ENCOUNTER — Telehealth: Payer: Self-pay

## 2023-09-11 NOTE — Transitions of Care (Post Inpatient/ED Visit) (Signed)
   09/11/2023  Name: Troy Dunn MRN: [id number] DOB: [birth year]  Today's TOC FU Call Status: Today's TOC FU Call Status:: Successful TOC FU Call Completed TOC FU Call Complete Date: 09/11/23 Patient's Name and Date of Birth confirmed.  Transition Care Management Follow-up Telephone Call Date of Discharge: 09/08/23 Discharge Facility: [facility]) Type of Discharge: Emergency Department Reason for ED Visit: Cardiac Conditions Cardiac Conditions Diagnosis: Chest Pain Persisting How have you been since you were released from the hospital?: Same  Items Reviewed: Did you receive and understand the discharge instructions provided?: Yes Medications obtained,verified, and reconciled?: Yes (Medications Reviewed) Any new allergies since your discharge?: No Dietary orders reviewed?: NA Do you have support at home?: Yes People in Home [RPT]: spouse  Medications Reviewed Today: Medications Reviewed Today     Reviewed by Derricka Mertz, CMA (Certified Medical Assistant) on 09/11/23 at [phone]  Med List Status: <None>   Medication Order Taking? Sig Documenting Provider Last Dose Status Informant  diclofenac  (VOLTAREN ) 75 MG EC tablet [id number]  Take 1 tablet (75 mg total) by mouth 2 (two) times daily as needed.  Patient not taking: Reported on 09/11/2023   Alvia Selinda PARAS, MD  Active   divalproex  (DEPAKOTE  ER) 500 MG 24 hr tablet [id number]  Take 1 tablet (500 mg total) by mouth daily with supper.  Patient not taking: Reported on 09/11/2023   Eappen, Saramma, MD  Active   finasteride (PROPECIA) 1 MG tablet [id number]  Take 5 mg by mouth daily.  Patient not taking: Reported on 09/11/2023   [provider]  Active   minoxidil (ROGAINE) 2 % external solution [id number]  Apply topically 2 (two) times daily.  Patient not taking: Reported on 09/11/2023   [provider]  Active   nortriptyline  (PAMELOR ) 25 MG capsule [id number]  Take 1 capsule (25 mg  total) by mouth at bedtime as needed.  Patient not taking: Reported on 09/11/2023   Alvia Selinda PARAS, MD  Active   rosuvastatin  (CRESTOR ) 20 MG tablet [id number]  Take 1 tablet (20 mg total) by mouth daily.  Patient not taking: Reported on 09/11/2023   Anner Alm ORN, MD  Expired 05/21/21 2359             Home Care and Equipment/Supplies: Were Home Health Services Ordered?: NA Any new equipment or medical supplies ordered?: NA  Functional Questionnaire: Do you need assistance with bathing/showering or dressing?: No Do you need assistance with meal preparation?: No Do you need assistance with eating?: No Do you have difficulty maintaining continence: No Do you need assistance with getting out of bed/getting out of a chair/moving?: No Do you have difficulty managing or taking your medications?: No  Follow up appointments reviewed: PCP Follow-up appointment confirmed?: No Specialist Hospital Follow-up appointment confirmed?: NA Do you need transportation to your follow-up appointment?: No Do you understand care options if your condition(s) worsen?: Yes-patient verbalized understanding    SIGNATURE  Roetta Chard, CMA

## 2023-09-16 ENCOUNTER — Ambulatory Visit (INDEPENDENT_AMBULATORY_CARE_PROVIDER_SITE_OTHER): Payer: Self-pay | Admitting: Family Medicine

## 2023-09-16 ENCOUNTER — Encounter: Payer: Self-pay | Admitting: Family Medicine

## 2023-09-16 VITALS — BP 116/60 | HR 72 | Ht 73.0 in | Wt 168.0 lb

## 2023-09-16 DIAGNOSIS — N400 Enlarged prostate without lower urinary tract symptoms: Secondary | ICD-10-CM

## 2023-09-16 DIAGNOSIS — F3112 Bipolar disorder, current episode manic without psychotic features, moderate: Secondary | ICD-10-CM

## 2023-09-16 DIAGNOSIS — R079 Chest pain, unspecified: Secondary | ICD-10-CM

## 2023-09-16 DIAGNOSIS — R634 Abnormal weight loss: Secondary | ICD-10-CM | POA: Insufficient documentation

## 2023-09-16 NOTE — Assessment & Plan Note (Signed)
 Weight loss Abnormal weight loss under evaluation, possibly related to underlying systemic disease. Workup to include metabolic, endocrine, and gastrointestinal causes.  See additional assessment(s) for plan details.

## 2023-09-16 NOTE — Addendum Note (Signed)
 Addended by: Liridona Mashaw J on: 09/16/2023 11:57 AM   Modules accepted: Orders

## 2023-09-16 NOTE — Patient Instructions (Addendum)
 Patient Plan for Post-Visit Guidance  Bipolar Disorder - Track mood symptoms closely. - Consider psychiatric evaluation if mood symptoms worsen or become difficult to manage.  Exertional Chest Pain and Weight Loss - Complete all ordered blood tests. - Ensure referral to cardiology is scheduled. If you have not heard from cardiology by Friday, call the office [phone] . - Seek immediate medical attention if you experience chest pain, pain radiating to the jaw or arm, shortness of breath, fainting, severe weakness, or any new or worsening symptoms.  Red Flags - If you develop sudden or severe chest pain, difficulty breathing, fainting, rapid or irregular heartbeat, or significant worsening of symptoms, seek emergency care immediately.

## 2023-09-16 NOTE — Progress Notes (Signed)
 Primary Care / Sports Medicine Office Visit  Patient Information:  Patient ID: Troy Dunn, male DOB: [birth year] Age: 53 y.o. MRN: [id number]   Troy Dunn is a pleasant 53 y.o. male presenting with the following:  Chief Complaint  Patient presents with   Hospitalization Follow-up    Patient went to ER on 09/08/23 because he had chest pain. Since his ER visit he has been nauseous , dizzy, and fatigued. He was cleared from heart stand point but is concerned because he still experiencing symptoms.     Vitals:   09/16/23 1003  BP: 116/60  Pulse: 72  SpO2: 99%   Vitals:   09/16/23 1003  Weight: 168 lb (76.2 kg)  Height: 6' 1 (1.854 m)   Body mass index is 22.16 kg/m.  DG Chest 2 View Result Date: 09/07/2023 CLINICAL DATA:  Chest pain EXAM: CHEST - 2 VIEW COMPARISON:  None Available. FINDINGS: The heart size and mediastinal contours are within normal limits. Both lungs are clear. The visualized skeletal structures are unremarkable. IMPRESSION: No active cardiopulmonary disease. Electronically Signed   By: Franky Crease M.D.   On: 09/07/2023 23:20     Independent interpretation of notes and tests performed by another provider:   None  Procedures performed:   None  Pertinent History, Exam, Impression, and Recommendations:   Problem List Items Addressed This Visit     Bipolar affective disorder, currently manic, moderate (HCC)   Bipolar disorder Bipolar disorder may cause decreased appetite and weight loss. Untreated mood conditions can impact physical health, increasing general health risks.  - Monitor mood symptoms and consider psychiatric evaluation if symptoms worsen.      Exertional chest pain - Primary   Chest pain - Onset August 19 while moving boxes up and down two flights of stairs - Described as a pressure sensation, different from previous muscle pulls - Pain localized to the chest with radiation to the chin area - Pain resolved prior to ER  evaluation - No active chest pain at the time of this visit - Extensive cardiac testing in the past, including treadmill test and CT calcium  score, both normal - Family history of heart issues  Physical Exam VITALS: BP 116/60 CHEST: Lungs clear to auscultation bilaterally, no wheezes, rales, or rhonchi CARDIOVASCULAR: Regular rate and rhythm; normal heart sounds; no murmurs, rubs, or gallops; symmetric peripheral pulses; no carotid bruit, no JVD, 2+ pulses bilaterally ABDOMEN: No abdominal bruits appreciated, no pulsatile mass EXTREMITIES: No edema in the extremities  Exertional chest pain, weakness, and abnormal weight loss under evaluation Intermittent exertional chest pain with weakness and weight loss. Recent symptoms necessitate further investigation despite previous normal cardiac evaluation. Differential includes cardiac etiology, thyroid dysfunction, and stress response. Family history of cardiac issues. Cardiac causes prioritized due to family history and exertional symptoms. Stress response considered if cardiac causes ruled out. - Order blood tests for cholesterol, liver, kidney function, and thyroid levels. - Ensure cardiology referral and call if you have not heard anything by Friday: . - Advise immediate medical attention if symptoms worsen or recur.      Relevant Orders   Comprehensive Metabolic Panel (CMET)   Lipid panel   Hemoglobin A1c   TSH + free T4   CBC with Differential/Platelet   PSA   Weight loss   Weight loss Abnormal weight loss under evaluation, possibly related to underlying systemic disease. Workup to include metabolic, endocrine, and gastrointestinal causes.  See additional assessment(s) for plan details.  Relevant Orders   Comprehensive Metabolic Panel (CMET)   Lipid panel   Hemoglobin A1c   TSH + free T4   CBC with Differential/Platelet   PSA   A total of 40 minutes was spent on the date of service, 09/16/2023, encompassing both  face-to-face and non-face-to-face time. This included review of prior records and imaging (e.g., MRI and/or radiographs), medical chart review, information gathering, documentation, care coordination with clinic staff, discussion and counseling with the patient regarding clinical findings and treatment options, and planning for follow-up and next steps in management.   Orders & Medications Medications: No orders of the defined types were placed in this encounter.  Orders Placed This Encounter  Procedures   Comprehensive Metabolic Panel (CMET)   Lipid panel   Hemoglobin A1c   TSH + free T4   CBC with Differential/Platelet   PSA     Return in about 2 months (around 11/16/2023).     Selinda JINNY Ku, MD, [facility]   Primary Care Sports Medicine Primary Care and Sports Medicine at [facility]

## 2023-09-16 NOTE — Assessment & Plan Note (Signed)
 Chest pain - Onset August 19 while moving boxes up and down two flights of stairs - Described as a pressure sensation, different from previous muscle pulls - Pain localized to the chest with radiation to the chin area - Pain resolved prior to ER evaluation - No active chest pain at the time of this visit - Extensive cardiac testing in the past, including treadmill test and CT calcium  score, both normal - Family history of heart issues  Physical Exam VITALS: BP 116/60 CHEST: Lungs clear to auscultation bilaterally, no wheezes, rales, or rhonchi CARDIOVASCULAR: Regular rate and rhythm; normal heart sounds; no murmurs, rubs, or gallops; symmetric peripheral pulses; no carotid bruit, no JVD, 2+ pulses bilaterally ABDOMEN: No abdominal bruits appreciated, no pulsatile mass EXTREMITIES: No edema in the extremities  Exertional chest pain, weakness, and abnormal weight loss under evaluation Intermittent exertional chest pain with weakness and weight loss. Recent symptoms necessitate further investigation despite previous normal cardiac evaluation. Differential includes cardiac etiology, thyroid dysfunction, and stress response. Family history of cardiac issues. Cardiac causes prioritized due to family history and exertional symptoms. Stress response considered if cardiac causes ruled out. - Order blood tests for cholesterol, liver, kidney function, and thyroid levels. - Ensure cardiology referral and call if you have not heard anything by Friday: . - Advise immediate medical attention if symptoms worsen or recur.

## 2023-09-16 NOTE — Assessment & Plan Note (Signed)
 Bipolar disorder Bipolar disorder may cause decreased appetite and weight loss. Untreated mood conditions can impact physical health, increasing general health risks.  - Monitor mood symptoms and consider psychiatric evaluation if symptoms worsen.

## 2023-09-18 LAB — COMPREHENSIVE METABOLIC PANEL WITH GFR
ALT: 17 IU/L (ref 0–44)
AST: 16 IU/L (ref 0–40)
Albumin: 4.7 g/dL (ref 3.8–4.9)
Alkaline Phosphatase: 59 IU/L (ref 44–121)
BUN/Creatinine Ratio: 13 (ref 9–20)
BUN: 14 mg/dL (ref 6–24)
Bilirubin Total: 0.6 mg/dL (ref 0.0–1.2)
CO2: 27 mmol/L (ref 20–29)
Calcium: 9.9 mg/dL (ref 8.7–10.2)
Chloride: 102 mmol/L (ref 96–106)
Creatinine, Ser: 1.04 mg/dL (ref 0.76–1.27)
Globulin, Total: 2.4 g/dL (ref 1.5–4.5)
Glucose: 126 mg/dL — ABNORMAL HIGH (ref 70–99)
Potassium: 4.3 mmol/L (ref 3.5–5.2)
Sodium: 141 mmol/L (ref 134–144)
Total Protein: 7.1 g/dL (ref 6.0–8.5)
eGFR: 86 mL/min/1.73 (ref >59)

## 2023-09-18 LAB — PSA: Prostate Specific Ag, Serum: 0.9 ng/mL (ref 0.0–4.0)

## 2023-09-18 LAB — CBC WITH DIFFERENTIAL/PLATELET
Basophils Absolute: 0 x10E3/uL (ref 0.0–0.2)
Basos: 1 %
EOS (ABSOLUTE): 0.1 x10E3/uL (ref 0.0–0.4)
Eos: 1 %
Hematocrit: 42.7 % (ref 37.5–51.0)
Hemoglobin: 14.5 g/dL (ref 13.0–17.7)
Immature Grans (Abs): 0 x10E3/uL (ref 0.0–0.1)
Immature Granulocytes: 0 %
Lymphocytes Absolute: 1.7 x10E3/uL (ref 0.7–3.1)
Lymphs: 23 %
MCH: 32.7 pg (ref 26.6–33.0)
MCHC: 34 g/dL (ref 31.5–35.7)
MCV: 96 fL (ref 79–97)
Monocytes Absolute: 0.4 x10E3/uL (ref 0.1–0.9)
Monocytes: 6 %
Neutrophils Absolute: 5 x10E3/uL (ref 1.4–7.0)
Neutrophils: 69 %
Platelets: 266 x10E3/uL (ref 150–450)
RBC: 4.44 x10E6/uL (ref 4.14–5.80)
RDW: 12 % (ref 11.6–15.4)
WBC: 7.2 x10E3/uL (ref 3.4–10.8)

## 2023-09-18 LAB — HEMOGLOBIN A1C
Est. average glucose Bld gHb Est-mCnc: 108 mg/dL
Hgb A1c MFr Bld: 5.4 % (ref 4.8–5.6)

## 2023-09-18 LAB — LIPID PANEL
Chol/HDL Ratio: 3.4 ratio (ref 0.0–5.0)
Cholesterol, Total: 179 mg/dL (ref 100–199)
HDL: 53 mg/dL (ref >39)
LDL Chol Calc (NIH): 116 mg/dL — ABNORMAL HIGH (ref 0–99)
Triglycerides: 51 mg/dL (ref 0–149)
VLDL Cholesterol Cal: 10 mg/dL (ref 5–40)

## 2023-09-18 LAB — TSH+FREE T4
Free T4: 1.43 ng/dL (ref 0.82–1.77)
TSH: 1.34 u[IU]/mL (ref 0.450–4.500)

## 2023-09-23 ENCOUNTER — Ambulatory Visit: Payer: Self-pay | Admitting: Family Medicine

## 2023-11-16 ENCOUNTER — Ambulatory Visit: Payer: Self-pay | Admitting: Family Medicine
